# Patient Record
Sex: Female | Born: 1965 | Race: White | Hispanic: No | Marital: Married | Smoking: Never smoker
Health system: Southern US, Community
[De-identification: ages and names within clinical notes are randomized; demographics above are authoritative.]

## PROBLEM LIST (undated history)

## (undated) DIAGNOSIS — K5900 Constipation, unspecified: Secondary | ICD-10-CM

## (undated) DIAGNOSIS — E039 Hypothyroidism, unspecified: Secondary | ICD-10-CM

## (undated) DIAGNOSIS — E785 Hyperlipidemia, unspecified: Secondary | ICD-10-CM

## (undated) DIAGNOSIS — G40909 Epilepsy, unspecified, not intractable, without status epilepticus: Secondary | ICD-10-CM

## (undated) DIAGNOSIS — G43909 Migraine, unspecified, not intractable, without status migrainosus: Secondary | ICD-10-CM

## (undated) HISTORY — DX: Constipation, unspecified: K59.00

## (undated) HISTORY — DX: Epilepsy, unspecified, not intractable, without status epilepticus: G40.909

## (undated) HISTORY — DX: Migraine, unspecified, not intractable, without status migrainosus: G43.909

---

## 1990-11-24 HISTORY — PX: CERVICAL CONE BIOPSY: SUR198

## 1993-11-24 HISTORY — PX: TUBAL LIGATION: SHX77

## 1998-11-24 HISTORY — PX: BREAST BIOPSY: SHX20

## 2000-11-24 HISTORY — PX: TOTAL COLECTOMY: SHX852

## 2003-11-25 HISTORY — PX: RECTOCELE REPAIR: SHX761

## 2003-11-25 HISTORY — PX: CYSTOCELE REPAIR: SHX163

## 2003-11-25 HISTORY — PX: VAGINAL HYSTERECTOMY: SUR661

## 2010-02-25 ENCOUNTER — Inpatient Hospital Stay

## 2010-02-25 NOTE — Unmapped (Signed)
Signed by   LinkLog[employer] on 02/25/2010 at 11:39:34  Patient: [name]  Note: All result statuses are Final unless otherwise noted.    Tests: (1) CT-ABDOMEN AND PELVIS WITH CONTRAST [phone])    Order Note: RadNet Accession Number: [id number]    Order Note:                               [facility]     Patient: Casey Harrington, Casey Harrington   DOB:     [birth year]   MRN:     [id number]   FIN:       [id number]   Accn#:   [id number]                                   Computed Tomography     Exam                       Exam Date/Time       Ordering Physician   CT-ABDOMEN AND PELVIS      02/25/2010 11:06 EDT Dena Billet MD, NAV K   WITH CONTRAST     Reason for Exam   LUQ pain, eval for intra-abdominal adhesions 100 mL isovue 370     Report   CT OF THE ABDOMEN AND PELVIS WITH CONTRAST:     REASON FOR EXAM: Left upper quadrant pain     TECHNIQUE: CT of the abdomen and pelvis performed with 5 mm axial images   with additional coronal and sagittal  reconstructions after the   administration of 100  mL of Isovue-370 intravenous contrast as well as   oral contrast.     COMPARISON: None.     FINDINGS:     CT ABDOMEN:     Evidence of prior total or near total colectomy with anastomosis in the   lower pelvis, the region of the expected location of the rectum, without   complicating features or evidence of obstruction. Small bowel normal in   appearance. There are numerous small hypodense lesions scattered throughout   both lobes of the liver, the largest measuring 11 mm in greatest diameter.   Lesions are too small to accurately characterize. Liver otherwise normal in   appearance. Nondependent 9 mm potentially enhancing focus within the   gallbladder fundus, suspicious for a polyp. Gallbladder otherwise normal in   appearance. The pancreas, spleen, adrenal glands, and kidneys are   unremarkable. No lymphadenopathy or ascites. No acute or suspicious bony   abnormality. Visualized lung bases are clear.     CT PELVIS:     There is again evidence of prior colectomy. Uterus surgically absent. No   lymphadenopathy or ascites. Visualized small bowel pelvis normal in   appearance. No acute or suspicious bony abnormality. Bladder unremarkable.     IMPRESSION:     Status post colectomy without evidence of obstruction.         9 mm potentially enhancing lesion within the nondependent gallbladder,   suspicious for a polyp. Further evaluation with with a right upper quadrant   ultrasound could be helpful.     Numerous small hypodense lesions scattered throughout both lobes of the   liver. In the absence of known malignancy, findings are most likely   secondary to multiple small cysts.   ********** VERIFIED REPORT **********     Dictated: 02/25/2010  11:23 am     Rema Jasmine M.D., Estelle Grumbles   Signed (Electronic Signature):  Rema Jasmine M.D., Estelle Grumbles.          02/25/10   11:31     Technologist: Siri Cole    Order Note:   EMR Routing to: Cherre Blanc - copy to - [id number]    Note: An exclamation mark (!) indicates a result that was not dispersed into   the flowsheet.  Document Creation Date: 02/25/2010 11:39 AM  _______________________________________________________________________    (1) Order result status: Final  Collection or observation date-time: 02/25/2010 11:06:55  Requested date-time: 02/25/2010 10:01:00  Receipt date-time:   Reported date-time: 02/25/2010 11:31:46  Referring Physician:    Ordering Physician:  Non-EMR Physician [facility])  Specimen Source: RAD&Rad Type  Source: QRS  Filler Order Number: [id number]  Lab site: [facility]

## 2010-03-11 ENCOUNTER — Inpatient Hospital Stay

## 2010-03-19 ENCOUNTER — Inpatient Hospital Stay

## 2013-07-14 ENCOUNTER — Ambulatory Visit: Admit: 2013-07-14 | Payer: PRIVATE HEALTH INSURANCE | Attending: Neurology

## 2013-07-14 DIAGNOSIS — R569 Unspecified convulsions: Secondary | ICD-10-CM

## 2013-07-14 MED ORDER — levETIRAcetam (KEPPRA) 500 MG tablet
500 | ORAL_TABLET | Freq: Two times a day (BID) | ORAL | Status: AC
Start: 2013-07-14 — End: 2014-11-09
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
## 2013-07-21 ENCOUNTER — Inpatient Hospital Stay: Admit: 2013-07-21 | Payer: PRIVATE HEALTH INSURANCE | Attending: Neurology

## 2013-07-21 DIAGNOSIS — R569 Unspecified convulsions: Secondary | ICD-10-CM

## 2013-07-21 MED ORDER — GADAVIST (gadobutrol) Soln 6 mL
1 | Freq: Once | INTRAVENOUS | Status: AC | PRN
Start: 2013-07-21 — End: 2013-07-21
  Administered 2013-07-21: 18:00:00 via INTRAVENOUS

## 2013-07-21 NOTE — Unmapped (Signed)
Procedures  [facility]                    PATIENT NAME: Casey Harrington, Casey Harrington   MR#: [id number]  DATE OF BIRTH:   July 26, 1966        CSN#: [id number]  ATTENDING:   Oris Drone. Derek Jack. D.          ADMIT DATE: 07/21/2013  DICTATED BY: Charlestine Night. Ramandeep Arington, M.D.  DATE OF EEG: 07/21/2013                 ELECTROENCEPHALOGRAM        PROCEDURE PERFORMED:  Routine Electroencephalogram      CLINICAL INFORMATION:  This is an evaluation of a 47 year old woman for seizures    MEDICATIONS:  See chart    TECHNICAL SUMMARY:  This EEG was performed with a multi-channel Grass Digital EEG System.  In addition to the standard 10-20 electrode array additional channels monitored ECG and eye movement activity.  The data was recorded digitally.      There is a symmetrical and reactive 9-10 Hz occipital dominant rhythm.  Low voltage 18-22 Hz activity was seen maximally in the anterior regions.        Photic Stimulation:  No abnormalities noted with various frequencies of flickering light  Hyperventilation:  No abnormalities noted during three minutes of over-breathing; there was a mild to moderate build-up of generalized slow activity  Sleep:  Brief periods of drowsiness and spindle stage sleep were seen with no new findings      IMPRESSION:  This patient???s routine EEG during the waking and sleep state showed no focal, lateralizing or epileptiform features and is within the range of normal variation.        Rod Holler  07/21/2013

## 2013-07-22 NOTE — Unmapped (Signed)
I had the opportunity to review the patient's MRI brain and EEG wake and sleep. The patient's EEG wake and sleep revealed no epileptiform abnormalities. According to the radiology read, the patient's MRI showed no definitive lesions that would predispose to future seizures. There was mention of nonspecific white matter changes in the left frontal lobe. I reviewed these and did not feel they had an epileptiform significance. However, when I personally reviewed the MRI brain, I felt of the right hippocampus appeared slightly smaller than the left. There was no obvious signal change on T2 or FLAIR.    Given this, her imaging may be suggestive of seizures arising from the right temporal region. Although the appearance was not classic for mesial temporal sclerosis, there was some atrophy on my review.    I attempted to call the patient back to relate the results of these tests. She was not home. I left a detailed message explaining the above. She is to contact my medical assistant should questions or concerns arise.

## 2013-08-18 ENCOUNTER — Ambulatory Visit: Admit: 2013-08-18 | Payer: PRIVATE HEALTH INSURANCE | Attending: Neurology

## 2013-08-18 DIAGNOSIS — G40109 Localization-related (focal) (partial) symptomatic epilepsy and epileptic syndromes with simple partial seizures, not intractable, without status epilepticus: Secondary | ICD-10-CM

## 2013-08-18 NOTE — Unmapped (Signed)
Subjective:      Patient ID: Casey Harrington is a 47 y.o. female. She presents back to my epilepsy clinic for further evaluation and treatment of likely focal onset seizures.    HPI   According to the patient, she has experienced a marked improvement in her seizure like episodes since starting Keppra 500 mg PO b.i.d. According to the patient, she has only suffered two further episodes concerning for mild seizures since last seeing me. At the time that she saw me last, she was experiencing episodes concerning for seizures at least weekly. According to the patient, the two episodes have only consisted of the development of a ???real bad??? headache and feeling floaty. The headache lasted throughout much of the day and the floaty feeling lasted for approximately 20 minutes each time. However it did not evolve into the other symptoms outlined in my last note. Specifically, there was no impairment of awareness with either of these seizure like episodes.    When the patient first started on Keppra she felt quite exhausted for the first week. However this has improved. 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    She was somewhat concerned that her memory has not gotten any better since initiating on medicine. She is also noticed some possible changes in her personality. She cries more often now. However she is under more stress at home and at school.    Histories:     She has a past medical history of Seizures.    She has past surgical history that includes Hysterectomy; Bladder surgery; Rectal prolapse repair; Tubal ligation; and Total colectomy.    Her family history includes Cancer in her father and mother; Diabetes in her father and mother; Heart disease in her father; and Migraines in her daughter.    She reports that she has never smoked. She does not have any smokeless tobacco history on file. She reports that  drinks alcohol.      Review of Systems    Neurological: Positive for seizures.   All other systems reviewed and are negative.        Allergies:   Review of patient's allergies indicates no known allergies.    Medications:     Outpatient Encounter Prescriptions as of 08/18/2013   Medication Sig Dispense Refill   ??? levETIRAcetam (KEPPRA) 500 MG tablet Take 1 tablet (500 mg total) by mouth 2 times a day.  200 tablet  3   ??? LINACLOTIDE (LINZESS ORAL) Take by mouth.         No facility-administered encounter medications on file as of 08/18/2013.        Objective:       Blood pressure 102/68, pulse 60, height 5' 1.75 (1.568 m), weight 136 lb (61.689 kg).    Neurologic Exam  Mental Status   Oriented to person, place, and time.   Registration: recalls 3 of 3 objects. Recall at 5 minutes: recalls 3 of 3 objects. Follows 3 step commands.   Attention: normal. Concentration: normal.   Speech: speech is normal   Level of consciousness: alert   Knowledge: good. Able to perform simple calculations.   Able to name object. Able to read. Able to repeat. Able to write. Normal comprehension.   Cranial Nerves   CN II   Visual fields full to confrontation.   Visual acuity: normal  Right visual field deficit: none  Left visual field deficit: none  Normal fundoscopic exam bilaterally  CN III, IV, VI   Pupils are equal, round, and reactive to light.  Extraocular motions are normal.   Right pupil: Shape: regular. Reactivity: brisk. Consensual response: intact. Accommodation: intact.   Left pupil: Shape: regular. Reactivity: brisk. Consensual response: intact. Accommodation: intact.   CN III: no CN III palsy  CN VI: no CN VI palsy  Nystagmus: none   Ophthalmoparesis: none  Upgaze: normal  Downgaze: normal  Conjugate gaze: present  Vestibulo-ocular reflex: present   CN V   Facial sensation intact.   CN VII   Facial expression full, symmetric.   CN VIII   CN VIII normal.   CN IX, X   CN IX normal.   CN X normal.   CN XI   CN XI normal.   CN XII   CN XII normal.   Motor Exam   Muscle  bulk: normal  Overall muscle tone: normal  Right arm tone: normal  Left arm tone: normal  Right arm pronator drift: absent  Left arm pronator drift: absent  Right leg tone: normal  Left leg tone: normal   Strength   Strength 5/5 throughout.   Right neck flexion: 5/5   Left neck flexion: 5/5   Right neck extension: 5/5   Left neck extension: 5/5   Right deltoid: 5/5   Left deltoid: 5/5   Right biceps: 5/5   Left biceps: 5/5   Right triceps: 5/5   Left triceps: 5/5   Right wrist flexion: 5/5   Left wrist flexion: 5/5   Right wrist extension: 5/5   Left wrist extension: 5/5   Right interossei: 5/5   Left interossei: 5/5   Right abdominals: 5/5   Left abdominals: 5/5   Right iliopsoas: 5/5   Left iliopsoas: 5/5   Right quadriceps: 5/5   Left quadriceps: 5/5   Right hamstring: 5/5   Left hamstring: 5/5   Right glutei: 5/5   Left glutei: 5/5   Right anterior tibial: 5/5   Left anterior tibial: 5/5   Right posterior tibial: 5/5   Left posterior tibial: 5/5   Right peroneal: 5/5   Left peroneal: 5/5   Right gastroc: 5/5   Left gastroc: 5/5   Sensory Exam   Light touch normal.   Vibration normal.   Proprioception normal.   Pinprick normal.   Gait, Coordination, and Reflexes   Gait   Gait: normal   Coordination   Romberg: negative  Finger to nose coordination: normal  Heel to shin coordination: normal  Tandem walking coordination: normal   Reflexes   Right brachioradialis: 2+  Left brachioradialis: 2+  Right biceps: 2+  Left biceps: 2+  Right triceps: 2+  Left triceps: 2+  Right patellar: 2+  Left patellar: 2+  Right achilles: 2+  Left achilles: 2+  Right grip: 2+  Left grip: 2+  Right plantar: normal  Left plantar: normal   Physical Exam  Constitutional: oriented to person, place, and time. Appears well-developed and well-nourished.   HENT:   Head: Normocephalic.   Eyes: EOM are normal. Pupils are equal, round, and reactive to light.   Neck: Normal range of motion.   Cardiovascular: Normal rate, regular rhythm and normal  heart sounds.   Pulmonary/Chest: Effort normal and breath sounds normal.  Musculoskeletal: Normal range of motion.  Psychiatric: normal mood and affect. Speech is normal.     Assessment:     I. Likely focal onset seizures    Plan:  I had the opportunity to see the patient back in my epilepsy clinic. I was delighted to learn that her seizure like episodes have improved on Keppra. They???ve improved in frequency. In addition, the two episodes that she has experience sense starting Keppra have all been without obvious impairment of awareness.    I???m not solely convinced that the recent personality changes are solely due to Keppra. There are other circumstances in the patient???s life (stresses at home and school) that could be contributing to those personality changes as well. Therefore I didn???t feel that we should give up on this medicine yet.    Given that she continues to have seizure like episodes, I felt that a mild dose adjustment was in order. I recommended increasing her dose to 750 mg PO b.i.d. I warned her that the increase could be met with increased tiredness, drowsiness, and personality changes. She is to let me know if those arise immediately.    I offered the patient neuropsychometric testing to evaluate for her memory concerns. However, she wished to hold on those tests for now.    She has previously undergone tubal ligations. Therefore folic acid is not needed while on an antiepileptic drug. She is not of childbearing potential.    I will plan to see the patient back in my epilepsy clinic in two months. Should questions or concerns arise in the interim, she is to contact my medical assistant.    I spent 30 minutes with the patient. The majority was spent on counseling.

## 2013-09-23 NOTE — Unmapped (Signed)
Spoke with patient. Currently on Generic Keppra. Stated she feels range inside, very agitated, very impatient, and even thought of suicide. Medication was increased about 2-3 weeks ago to 1.5 tabs twice a day. Stated she actually stopped this medication 2 days ago, and is a totally different person. Has not had any suicide thoughts since stopping, more patient, and overall happier. Will be in class the next few hours, leave VM, and she will call back. Please respond back to the clinic pool for any additional follow up.

## 2013-09-23 NOTE — Unmapped (Signed)
Noted Pt. Made aware

## 2013-09-23 NOTE — Unmapped (Signed)
Noted. Dr.Moseley, Pt. Reports aggressive SE of Keppra and DC the use of. Please advise of another AED

## 2013-09-23 NOTE — Unmapped (Signed)
Ok to stay off of levetiracetam. Dr. Alferd Apa can review on his return to decide on another antiepileptic drug

## 2013-09-26 MED ORDER — OXcarbazepine (TRILEPTAL) 300 MG tablet
300 | ORAL_TABLET | Freq: Two times a day (BID) | ORAL | Status: AC
Start: 2013-09-26 — End: 2014-10-13

## 2013-09-26 NOTE — Unmapped (Signed)
I tried to call the patient to discuss her SE on Keppra and apparent discontinuation of this medication. She was not home. I left a message explaining her need to be on an AED for her CPS. Although Lamictal would have been a good medicine to start next, I was fearful of breakthrough seizures with LOC given the long titration needed for that AED. Therefore, I prescribed Trileptal. She is to start with 300 mg BID for one week, then she will increase the dose to 600 mg PO BID ongoing. I warned her about potential SEs on this medication, including (but not limited to) fatigue, sedation, and rash.  She will need to f/u with me in clinic in one month.

## 2013-11-03 ENCOUNTER — Encounter: Payer: PRIVATE HEALTH INSURANCE | Attending: Neurology

## 2013-11-03 NOTE — Unmapped (Signed)
This encounter was created in error - please disregard.

## 2014-10-13 MED ORDER — OXcarbazepine (TRILEPTAL) 300 MG tablet
300 | ORAL_TABLET | Freq: Two times a day (BID) | ORAL | Status: AC
Start: 2014-10-13 — End: 2014-11-09

## 2014-10-13 NOTE — Unmapped (Signed)
I called and relayed message below to pt. Pt had no further questions or concerns. Call completed

## 2014-10-13 NOTE — Unmapped (Signed)
Spoke with patient. She is scheduled for December 19 th. Patient is completely out of Oxcarbazepine 300 mg, takes 600 mg at hs.

## 2014-10-13 NOTE — Unmapped (Signed)
Pt is calling stating that she needs to have refills on her OXcarbazepine (TRILEPTAL) 300 MG tablet. She stated she missed two days worth of this med and is needing to speak with MD. She stated she is having some short term memory loss and is experiencing a HA on R frontal lobe 8-9/10 and is having little relief. Nausea, double vision, blurry vision. She stated the HA lasts for about a week and will have a week break then will start back up again. I transferred her to central scheduling for f/u. Please call her back at>[phone].

## 2014-10-13 NOTE — Unmapped (Signed)
This patient has not seen me in over one year. I provided a one month supply of Trileptal with no refills. She needs to f/u with me in one month for any further refills. We can discuss these questionable SE in clinic. If she has a terrible HA, she needs to go to an ED/urgent care.

## 2014-11-09 ENCOUNTER — Ambulatory Visit: Admit: 2014-11-09 | Payer: PRIVATE HEALTH INSURANCE | Attending: Neurology

## 2014-11-09 DIAGNOSIS — R569 Unspecified convulsions: Secondary | ICD-10-CM

## 2014-11-09 MED ORDER — OXcarbazepine (TRILEPTAL) 300 MG tablet
300 | ORAL_TABLET | Freq: Two times a day (BID) | ORAL | Status: AC
Start: 2014-11-09 — End: 2015-03-08

## 2014-11-09 NOTE — Unmapped (Addendum)
Subjective:      Patient ID: Casey Harrington is a 48 y.o. female. She presents back to my epilepsy clinic for further evaluation and treatment of likely focal onset seizures.      11/09/14:  Since her last visit she stopped taking LEV due to ASE, and began having seizures. She was switched to OXC, and could not tolerate 600mg  BID. She has been taking 600mg  qhs, and has been seizure for ~3 weeks. Taking the morning dose gave her too much fatigue. Currently, she also has an excessive amount of stress in her personal and professional life.     In addition, her memory has continued to decline. She states she has done poorly in school, but was able to graduate and find a job. She has to write everything down because she will consistently forget. She is still debating on whether or not to get neuropsychiatric testing.    She also has paroxsymal right sided headache that can last for up to 5 days. There are no autonomic features associated with this. OTC migraine medications have not helped.    HPI   According to the patient, she has experienced a marked improvement in her seizure like episodes since starting Keppra 500 mg PO b.i.d. According to the patient, she has only suffered two further episodes concerning for mild seizures since last seeing me. At the time that she saw me last, she was experiencing episodes concerning for seizures at least weekly. According to the patient, the two episodes have only consisted of the development of a ???real bad??? headache and feeling floaty. The headache lasted throughout much of the day and the floaty feeling lasted for approximately 20 minutes each time. However it did not evolve into the other symptoms outlined in my last note. Specifically, there was no impairment of awareness with either of these seizure like episodes.    When the patient first started on Keppra she felt quite exhausted for the first week. However this has improved. 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    She was somewhat concerned that her memory has not gotten any better since initiating on medicine. She is also noticed some possible changes in her personality. She cries more often now. However she is under more stress at home and at school.    Histories:     She has a past medical history of Seizures.    She has past surgical history that includes Hysterectomy; Bladder surgery; Rectal prolapse repair; Tubal ligation; and Total colectomy.    Her family history includes Cancer in her father and mother; Diabetes in her father and mother; Heart disease in her father; Migraines in her daughter.    She reports that she has never smoked. She does not have any smokeless tobacco history on file. She reports that she drinks alcohol.      Review of Systems   Neurological: Positive for seizures.   All other systems reviewed and are negative.        Allergies:   Review of patient's allergies indicates no known allergies.    Medications:     Outpatient Encounter Prescriptions as of 11/09/2014   Medication Sig Dispense Refill   ??? LINACLOTIDE (LINZESS ORAL) Take by mouth.     ??? OXcarbazepine (TRILEPTAL) 300 MG tablet Take 1 tablet (300 mg total) by mouth 2 times a day. Indications: COMPLEX-PARTIAL EPILEPSY, SEIZURES 180 tablet 3   ??? [DISCONTINUED]  OXcarbazepine (TRILEPTAL) 300 MG tablet Take 2 tablets (600 mg total) by mouth 2 times a day. Indications: COMPLEX-PARTIAL EPILEPSY 120 tablet 0   ??? [DISCONTINUED] levETIRAcetam (KEPPRA) 500 MG tablet Take 1 tablet (500 mg total) by mouth 2 times a day. 200 tablet 3     No facility-administered encounter medications on file as of 11/09/2014.        Objective:       Blood pressure 100/60, pulse 73, height 5' 2 (1.575 m), weight 134 lb (60.782 kg).    Neurologic Exam  Mental Status   Oriented to person, place, and time.   Registration:  Follows 3 step commands.   Attention: normal. Concentration: normal.   Speech:  speech is normal   Level of consciousness: alert   Knowledge: good. Able to perform simple calculations.   Able to name object. Able to read. Able to repeat. Able to write. Normal comprehension.   Cranial Nerves     CN III, IV, VI   Pupils are equal, round, and reactive to light.  Extraocular motions are normal.   Right pupil: Shape: regular. Reactivity: brisk. Consensual response: intact. Accommodation: intact.   Left pupil: Shape: regular. Reactivity: brisk. Consensual response: intact. Accommodation: intact.   CN III: no CN III palsy  CN VI: no CN VI palsy  Nystagmus: none   Ophthalmoparesis: none  Upgaze: normal  Downgaze: normal  Conjugate gaze: present  Vestibulo-ocular reflex: present   CN V   Facial sensation intact.   CN VII   Facial expression full, symmetric.   CN VIII   CN VIII normal.   CN IX, X   CN IX normal.   CN X normal.   CN XI   CN XI normal.   CN XII   CN XII normal.   Motor Exam   Muscle bulk: normal  Overall muscle tone: normal  Right arm tone: normal  Left arm tone: normal  Right arm pronator drift: absent  Left arm pronator drift: absent  Right leg tone: normal  Left leg tone: normal   Strength   Strength 5/5 throughout.   Right neck flexion: 5/5   Left neck flexion: 5/5   Right neck extension: 5/5   Left neck extension: 5/5   Right deltoid: 5/5   Left deltoid: 5/5   Right biceps: 5/5   Left biceps: 5/5   Right triceps: 5/5   Left triceps: 5/5   Right wrist flexion: 5/5   Left wrist flexion: 5/5   Right wrist extension: 5/5   Left wrist extension: 5/5   Right interossei: 5/5   Left interossei: 5/5   Right abdominals: 5/5   Left abdominals: 5/5   Right iliopsoas: 5/5   Left iliopsoas: 5/5   Right quadriceps: 5/5   Left quadriceps: 5/5   Right hamstring: 5/5   Left hamstring: 5/5   Right glutei: 5/5   Left glutei: 5/5   Right anterior tibial: 5/5   Left anterior tibial: 5/5   Right posterior tibial: 5/5   Left posterior tibial: 5/5   Right peroneal: 5/5   Left peroneal: 5/5   Right gastroc:  5/5   Left gastroc: 5/5   Sensory Exam   Light touch normal.   Vibration normal.   Proprioception normal.   Pinprick normal.   Gait, Coordination, and Reflexes   Gait   Gait: normal   Coordination   Romberg: negative  Finger to nose coordination: normal  Heel to shin coordination: normal  Tandem  walking coordination: normal   Reflexes   Right brachioradialis: 2+  Left brachioradialis: 2+  Right biceps: 2+  Left biceps: 2+  Right triceps: 2+  Left triceps: 2+  Right patellar: 2+  Left patellar: 2+  Right achilles: 2+  Left achilles: 2+  Right grip: 2+  Left grip: 2+  Right plantar: normal  Left plantar: normal   Physical Exam  Constitutional: oriented to person, place, and time. Appears well-developed and well-nourished.   HENT:   Head: Normocephalic.   Eyes: EOM are normal. Pupils are equal, round, and reactive to light.   Neck: Normal range of motion.   Cardiovascular: Normal rate, regular rhythm and normal heart sounds.   Pulmonary/Chest: Effort normal and breath sounds normal.  Musculoskeletal: Normal range of motion.  Psychiatric: normal mood and affect. Speech is normal.     Assessment:     I. 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 Will monitor for ASE and benefits. May consider Oxtellar or Aptiom in the future.    II- Offered neuropsych testing, but she declined at this time. She will call our MA if she changes her mind.    II- Offered indomethecin for diagnosis/treatment of her headaches, but she declined this as well. She will call if she changes her mind.    Follow up in 2-3 months    Safety:   - Driving Risk: Having a seizure while driving puts that patient at risk for injuring themselves, or others. If a patient drives while having uncontrolled seizures, they may be held liable for injuries to others.   Do not drive until they have been seizure free for at least 3 months.     Injury Risk: Please avoid working  from [employer], swimming alone or taking baths in a bathtub, use showers only.     Common Side Effects: Antiepileptic medications may cause somnolence, fatigue, dizziness, and cognitive side effects. Some of the medications can rarely cause bone marrow failure and liver failure. Specific side effects of the antiepileptic medications that the patient is currently taking or that the patient will start taking were also discussed with the patient.     Suicidality: The patient (caregiver, family) was (were) informed of the potential for an increase in the risk of suicidality with antiepileptic drugs. They are asked to notify either me or other healthcare providers of any unusual behavioral changes or suicidal ideation.    I had the opportunity to independently examine the patient and perform an independent history. I agree with the documentation as outlined above. Unfortunately, the patient had side effects when she increased her dose of Trileptal to 600 mg PO b.i.d. She stated that she became too fatigued to function at work. Therefore, without alerting [place], she lowered her dose of Trileptal to 600 mg PO QHS. She states she has experienced no seizures for approximately the last three weeks on this dosage.    I spent extensive time counseling the patient that once a day dosing of immediate release Trileptal would put her increased risk of seizures given the short half-life. She will either need to take this medicine twice a day or change to the extended-release formulation. The patient wanted to try a dose of 300 mg PO b.i.d. for now. Therefore, I switched her prescription to this. I provided a new prescription for a one year supply. She will need to make me  aware if she has any seizures while on this dose, as that will necessitate a dosage increase.    We discussed driving. The patient should not drive until she is completely free of seizures for at least three months. She must report her diagnosis of epilepsy to the  [facility].    The patient continues to complain of significant memory problems. I once again offered her neuropsychometric testing to discover the specific etiology of her memory complaints. She once again declined that consultation.    With regards to the patient???s headache, they have some characteristics of hemicrania continua. However, she has no autonomic signs to go along with them. I offered her a trial of indomethacin empirically. She declined that.    Given the medicine changes I???m making, I will plan to see the patient back in approximately 2 months. Should questions or concerns arise in the interim, my medical assistant should be contacted.    I spent 45 minutes with the patient, with the majority spent on counseling.    Oris Drone Alferd Apa MD  11/09/14

## 2014-11-10 NOTE — Unmapped (Signed)
Addended by: Cherlynn Perches on: 11/10/2014 11:22 AM     Modules accepted: Level of Service

## 2014-12-02 LAB — CBC AND DIFFERENTIAL
Basophils Absolute: 35 cells/uL (ref 0–200)
Basophils Relative: 0.7 %
Eosinophils Absolute: 200 cells/uL (ref 15–500)
Eosinophils Relative: 4 %
Hematocrit: 40.8 % (ref 35.0–45.0)
Hemoglobin: 13.7 g/dL (ref 11.7–15.5)
Lymphocytes Absolute: 1630 cells/uL (ref 850–3900)
Lymphocytes Relative: 32.6 %
MCH: 32.3 pg (ref 27.0–33.0)
MCHC: 33.6 g/dL (ref 32.0–36.0)
MCV: 96.1 fL (ref 80.0–100.0)
MPV: 7.4 fL (ref 7.5–11.5)
Monocytes Absolute: 355 cells/uL (ref 200–950)
Monocytes Relative: 7.1 %
Neutrophils Absolute: 2780 cells/uL (ref 1500–7800)
Neutrophils Relative: 55.6 %
Platelets: 227 10*3/uL (ref 140–400)
RBC: 4.25 10*6/uL (ref 3.80–5.10)
RDW: 13.4 % (ref 11.0–15.0)
WBC: 5 10*3/uL (ref 3.8–10.8)

## 2014-12-02 LAB — VITAMIN D 25 HYDROXY: Vit D, 25-Hydroxy: 54 ng/mL (ref 30–100)

## 2014-12-02 LAB — COMPREHENSIVE METABOLIC PANEL
A/G Ratio: 1.6 (calc) (ref 1.0–2.5)
ALT: 20 U/L (ref 6–29)
AST: 21 U/L (ref 10–35)
Albumin: 4.2 g/dL (ref 3.6–5.1)
Alkaline Phosphatase: 40 U/L (ref 33–115)
BUN: 7 mg/dL (ref 7–25)
CO2: 27 mmol/L (ref 19–30)
Calcium: 9 mg/dL (ref 8.6–10.2)
Chloride: 103 mmol/L (ref 98–110)
Creatinine: 0.87 mg/dL (ref 0.50–1.10)
GFR MDRD Af Amer: 92 mL/min/{1.73_m2} (ref >=60)
Globulin, Total: 2.6 g/dL (calc) (ref 1.9–3.7)
Glucose: 78 mg/dL (ref 65–99)
Potassium: 4 mmol/L (ref 3.5–5.3)
Sodium: 137 mmol/L (ref 135–146)
Total Bilirubin: 0.8 mg/dL (ref 0.2–1.2)
Total Protein: 6.8 g/dL (ref 6.1–8.1)
eGFR Non-Afr. American: 79 mL/min/{1.73_m2} (ref >=60)

## 2014-12-02 LAB — 10-HYDROXYCARBAZEPINE: 10-Hydroxycarbazepine: 10 ug/mL (ref 8.0–35.0)

## 2014-12-08 NOTE — Unmapped (Signed)
I called and lmom with results. Call completed

## 2014-12-08 NOTE — Unmapped (Signed)
-----   Message from Job Founds, MD sent at 12/07/2014  9:28 AM EST -----  When you have a moment, can you call this patient and let her know her recent blood work was normal? Thanks!    ----- Message -----     From: [facility] Results In Interface     Sent: 12/04/2014   8:28 AM       To: Job Founds, MD

## 2015-01-11 ENCOUNTER — Encounter: Payer: PRIVATE HEALTH INSURANCE | Attending: Neurology

## 2015-03-08 ENCOUNTER — Ambulatory Visit: Admit: 2015-03-08 | Payer: PRIVATE HEALTH INSURANCE | Attending: Neurology

## 2015-03-08 DIAGNOSIS — R569 Unspecified convulsions: Secondary | ICD-10-CM

## 2015-03-08 MED ORDER — OXcarbazepine (TRILEPTAL) 300 MG tablet
300 | ORAL_TABLET | Freq: Two times a day (BID) | ORAL | Status: AC
Start: 2015-03-08 — End: 2015-06-28

## 2015-03-08 MED ORDER — omeprazole (PRILOSEC) 20 MG capsule
20 | ORAL_CAPSULE | Freq: Every morning | ORAL | Status: AC
Start: 2015-03-08 — End: 2015-06-28

## 2015-03-08 MED ORDER — indomethacin (INDOCIN) 25 MG capsule
25 | ORAL_CAPSULE | Freq: Two times a day (BID) | ORAL | Status: AC
Start: 2015-03-08 — End: 2015-06-28

## 2015-03-08 NOTE — Unmapped (Signed)
Subjective:      Patient ID: Casey Harrington is a 49 y.o. female. She presents back to my epilepsy clinic for further evaluation and treatment of likely focal onset seizures.    HPI   Since my last clinic visit, the patient has experienced no further spells concerning for seizures. Her continued seizure freedom has come on Trileptal 300 mg PO b.i.d. Since switching to b.i.d. dosing, the patient has noted some mild tiredness in the morning. However, by slightly delaying her a.m. dose until 10:30 AM, she has been able to minimize this. She was not interested in potentially trying an extended-release formulation that she would take once a day at bedtime.    The patient continues to have intermittent headaches concerning for hemicrania continua. She presented to an ER and was prescribed Fioricet for this. She states her last episode was up to two weeks ago but they had been occurring more frequently before this. She will have autonomic symptoms with these headaches.    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    She has past surgical history that includes Hysterectomy; Bladder surgery; Rectal prolapse repair; Tubal ligation; and Total colectomy.    Her family history includes Cancer in her father and mother; Diabetes in her father and mother; Heart disease in her father; Migraines in her daughter.    She reports that she has never smoked. She does not have any smokeless tobacco history on file. She reports that she drinks alcohol.      Review of Systems   Neurological: Positive for seizures.   All other systems reviewed and are negative.        Allergies:   Review of patient's allergies indicates no known allergies.    Medications:     Outpatient Encounter Prescriptions as of 03/08/2015   Medication Sig Dispense Refill   ??? butalbital-aspirin-caffeine (FIORINAL) 50-325-40 mg Tab Take 1 tablet by mouth every 6 hours as needed.     ??? indomethacin  (INDOCIN) 25 MG capsule Take 1 capsule (25 mg total) by mouth 2 times a day with meals. 60 capsule 11   ??? LINACLOTIDE (LINZESS ORAL) Take by mouth.     ??? omeprazole (PRILOSEC) 20 MG capsule Take 1 capsule (20 mg total) by mouth every morning before breakfast. 30 capsule 11   ??? OXcarbazepine (TRILEPTAL) 300 MG tablet Take 1 tablet (300 mg total) by mouth 2 times a day. Indications: COMPLEX-PARTIAL EPILEPSY, SEIZURES 180 tablet 3   ??? [DISCONTINUED] OXcarbazepine (TRILEPTAL) 300 MG tablet Take 1 tablet (300 mg total) by mouth 2 times a day. Indications: COMPLEX-PARTIAL EPILEPSY, SEIZURES 180 tablet 3     No facility-administered encounter medications on file as of 03/08/2015.        Objective:       Blood pressure 102/58, pulse 55, height 5' 2 (1.575 m), weight 132 lb (59.875 kg), SpO2 99 %.    Neurologic Exam  Mental Status   Oriented to person, place, and time.   Registration:  Follows 3 step commands.   Attention: normal. Concentration: normal.   Speech: speech is normal   Level of consciousness: alert   Knowledge: good. Able to perform simple calculations.   Able to name object. Able to read. Able to repeat. Able to write. Normal comprehension.   Cranial Nerves     CN III, IV, VI   Pupils are equal, round, and reactive to light.  Extraocular motions  are normal.   Right pupil: Shape: regular. Reactivity: brisk. Consensual response: intact. Accommodation: intact.   Left pupil: Shape: regular. Reactivity: brisk. Consensual response: intact. Accommodation: intact.   CN III: no CN III palsy  CN VI: no CN VI palsy  Nystagmus: none   Ophthalmoparesis: none  Upgaze: normal  Downgaze: normal  Conjugate gaze: present  Vestibulo-ocular reflex: present   CN V   Facial sensation intact.   CN VII   Facial expression full, symmetric.   CN VIII   CN VIII normal.   CN IX, X   CN IX normal.   CN X normal.   CN XI   CN XI normal.   CN XII   CN XII normal.   Motor Exam   Muscle bulk: normal  Overall muscle tone: normal  Right arm  tone: normal  Left arm tone: normal  Right arm pronator drift: absent  Left arm pronator drift: absent  Right leg tone: normal  Left leg tone: normal   Strength   Strength 5/5 throughout.   Right neck flexion: 5/5   Left neck flexion: 5/5   Right neck extension: 5/5   Left neck extension: 5/5   Right deltoid: 5/5   Left deltoid: 5/5   Right biceps: 5/5   Left biceps: 5/5   Right triceps: 5/5   Left triceps: 5/5   Right wrist flexion: 5/5   Left wrist flexion: 5/5   Right wrist extension: 5/5   Left wrist extension: 5/5   Right interossei: 5/5   Left interossei: 5/5   Right abdominals: 5/5   Left abdominals: 5/5   Right iliopsoas: 5/5   Left iliopsoas: 5/5   Right quadriceps: 5/5   Left quadriceps: 5/5   Right hamstring: 5/5   Left hamstring: 5/5   Right glutei: 5/5   Left glutei: 5/5   Right anterior tibial: 5/5   Left anterior tibial: 5/5   Right posterior tibial: 5/5   Left posterior tibial: 5/5   Right peroneal: 5/5   Left peroneal: 5/5   Right gastroc: 5/5   Left gastroc: 5/5   Sensory Exam   Light touch normal.   Vibration normal.   Proprioception normal.   Pinprick normal.   Gait, Coordination, and Reflexes   Gait   Gait: normal   Coordination   Romberg: negative  Finger to nose coordination: normal  Heel to shin coordination: normal  Tandem walking coordination: normal   Reflexes   Right brachioradialis: 2+  Left brachioradialis: 2+  Right biceps: 2+  Left biceps: 2+  Right triceps: 2+  Left triceps: 2+  Right patellar: 2+  Left patellar: 2+  Right achilles: 2+  Left achilles: 2+  Right grip: 2+  Left grip: 2+  Right plantar: normal  Left plantar: normal   Physical Exam  Constitutional: oriented to person, place, and time. Appears well-developed and well-nourished.   HENT:   Head: Normocephalic.   Eyes: EOM are normal. Pupils are equal, round, and reactive to light.   Neck: Normal range of motion.   Cardiovascular: Normal rate, regular rhythm and normal heart sounds.   Pulmonary/Chest: Effort normal and  breath sounds normal.  Musculoskeletal: Normal range of motion.  Psychiatric: normal mood and affect. Speech is normal.     Assessment:     I. Focal onset seizures  II. 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 She has fortunately suffered no further seizures since her last clinic visit. She has largely tolerated switching her Trileptal to 300 mg PO b.i.d. Therefore we will continue this medicine. I provided a year???s prescription.    With regards to the patient???s headaches, they have symptoms that would be consistent with hemicrania continua. We discussed that Fioricet was not a good long-term treatment strategy for these. Therefore, she was finally amenable to a trial of indomethacin. She will start with a dose of 25 mg PO b.i.d. She is aware that this dose can be increased if needed up to 300 mg a day. She is aware that the chief side effect is G.I. upset. She is to take the medicine with meals. I also prescribed omeprazole 20 mg PO Q day to take along with it.    I will plan to see the patient back in clinic in one to two months. Should questions or concerns arise in the interim, my medical assistant should be contacted.    I spent 25 minutes with the patient, with the majority spent on counseling.

## 2015-05-03 ENCOUNTER — Encounter: Payer: PRIVATE HEALTH INSURANCE | Attending: Neurology

## 2015-05-14 NOTE — Unmapped (Signed)
Pt wants to know if she can still take her seizure meds w/Xarelto? Please advise.

## 2015-05-14 NOTE — Unmapped (Signed)
Noted, routed to MD on call

## 2015-05-14 NOTE — Unmapped (Signed)
If she is on OXC which is what the chart says, then it may lower the dose of xarelto, that's all could find  SM

## 2015-05-15 NOTE — Unmapped (Signed)
I called and spoke with pt and informed her of message below from MD. 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 I advised her to contact the MD that placed her on medication and ask if there is a recommended dose that she can take that wouldn't affect the oxcarb. She stated that she is unsure about what she should do and then stated that she would have to stop taking either the AED or medication for DVT. I advised her to not stop taking both medications but to consult pharmacy that she picks rx up from to see if there is any interaction with the medications. Pt stated that she will and that she also needed to schedule a f/u with MD in [facility]. Pt was scheduled for 8/4 at 12:50 with MD. No further questions or concerns. Call completed

## 2015-06-28 ENCOUNTER — Encounter: Admit: 2015-06-28 | Payer: PRIVATE HEALTH INSURANCE | Attending: Neurology

## 2015-06-28 MED ORDER — indomethacin (INDOCIN) 25 MG capsule
25 | ORAL_CAPSULE | Freq: Two times a day (BID) | ORAL | Status: AC
Start: 2015-06-28 — End: 2016-08-28

## 2015-06-28 MED ORDER — omeprazole (PRILOSEC) 20 MG capsule
20 | ORAL_CAPSULE | Freq: Every morning | ORAL | Status: AC
Start: 2015-06-28 — End: 2016-08-28

## 2015-06-28 MED ORDER — OXcarbazepine (TRILEPTAL) 300 MG tablet
300 | ORAL_TABLET | Freq: Two times a day (BID) | ORAL | Status: AC
Start: 2015-06-28 — End: 2016-08-28

## 2015-06-28 NOTE — Unmapped (Signed)
Subjective:      Patient ID: Casey Harrington is a 49 y.o. female. She presents back to my epilepsy clinic for further evaluation and treatment of likely focal onset seizures.    HPI   The patient has experienced no further seizures since the time of her last clinic visit. Since her last clinic visit, she has been diagnosed with a DVT in the lower extremities. She had symptoms of pain in the lower legs that led to the diagnosis. She is now on Xarelto.    Given that she is now on Xarelto, her prescribing physician advised that she stopped indomethacin. This was significantly helping to take the edge off of her headaches. She states her headaches have worsened since coming off indomethacin 25 mg PO b.i.d. She denied side effects on indomethacin such as G.I. upset when she was on this as well as Prilosec.    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    She has past surgical history that includes Hysterectomy; Bladder surgery; Rectal prolapse repair; Tubal ligation; and Total colectomy.    Her family history includes Cancer in her father and mother; Diabetes in her father and mother; Heart disease in her father; Migraines in her daughter.    She reports that she has never smoked. She does not have any smokeless tobacco history on file. She reports that she drinks alcohol.      Review of Systems   Neurological: Positive for seizures.   All other systems reviewed and are negative.        Allergies:   Review of patient's allergies indicates no known allergies.    Medications:     Outpatient Encounter Prescriptions as of 06/28/2015   Medication Sig Dispense Refill   ??? butalbital-aspirin-caffeine (FIORINAL) 50-325-40 mg Tab Take 1 tablet by mouth every 6 hours as needed.     ??? indomethacin (INDOCIN) 25 MG capsule Take 1 capsule (25 mg total) by mouth 2 times a day with meals. 180 capsule 3   ??? LINACLOTIDE (LINZESS ORAL) Take by mouth.     ??? omeprazole  (PRILOSEC) 20 MG capsule Take 1 capsule (20 mg total) by mouth every morning before breakfast. 90 capsule 3   ??? OXcarbazepine (TRILEPTAL) 300 MG tablet Take 1 tablet (300 mg total) by mouth 2 times a day. Indications: COMPLEX-PARTIAL EPILEPSY, SEIZURES 180 tablet 3   ??? rivaroxaban (XARELTO) 20 mg Tab Take 20 mg by mouth daily.     ??? [DISCONTINUED] indomethacin (INDOCIN) 25 MG capsule Take 1 capsule (25 mg total) by mouth 2 times a day with meals. 60 capsule 11   ??? [DISCONTINUED] omeprazole (PRILOSEC) 20 MG capsule Take 1 capsule (20 mg total) by mouth every morning before breakfast. 30 capsule 11   ??? [DISCONTINUED] OXcarbazepine (TRILEPTAL) 300 MG tablet Take 1 tablet (300 mg total) by mouth 2 times a day. Indications: COMPLEX-PARTIAL EPILEPSY, SEIZURES 180 tablet 3     No facility-administered encounter medications on file as of 06/28/2015.        Objective:       Blood pressure 100/66, pulse 62, height 5' 2 (1.575 m), weight 128 lb (58.06 kg).    Neurologic Exam  Mental Status   Oriented to person, place, and time.   Registration:  Follows 3 step commands.   Attention: normal. Concentration: normal.   Speech: speech is normal   Level of consciousness: alert   Knowledge: good. Able to  perform simple calculations.   Able to name object. Able to read. Able to repeat. Able to write. Normal comprehension.   Cranial Nerves     CN III, IV, VI   Pupils are equal, round, and reactive to light.  Extraocular motions are normal.   Right pupil: Shape: regular. Reactivity: brisk. Consensual response: intact. Accommodation: intact.   Left pupil: Shape: regular. Reactivity: brisk. Consensual response: intact. Accommodation: intact.   CN III: no CN III palsy  CN VI: no CN VI palsy  Nystagmus: none   Ophthalmoparesis: none  Upgaze: normal  Downgaze: normal  Conjugate gaze: present  Vestibulo-ocular reflex: present   CN V   Facial sensation intact.   CN VII   Facial expression full, symmetric.   CN VIII   CN VIII normal.   CN IX, X    CN IX normal.   CN X normal.   CN XI   CN XI normal.   CN XII   CN XII normal.   Motor Exam   Muscle bulk: normal  Overall muscle tone: normal  Right arm tone: normal  Left arm tone: normal  Right arm pronator drift: absent  Left arm pronator drift: absent  Right leg tone: normal  Left leg tone: normal   Strength   Strength 5/5 throughout.   Right neck flexion: 5/5   Left neck flexion: 5/5   Right neck extension: 5/5   Left neck extension: 5/5   Right deltoid: 5/5   Left deltoid: 5/5   Right biceps: 5/5   Left biceps: 5/5   Right triceps: 5/5   Left triceps: 5/5   Right wrist flexion: 5/5   Left wrist flexion: 5/5   Right wrist extension: 5/5   Left wrist extension: 5/5   Right interossei: 5/5   Left interossei: 5/5   Right abdominals: 5/5   Left abdominals: 5/5   Right iliopsoas: 5/5   Left iliopsoas: 5/5   Right quadriceps: 5/5   Left quadriceps: 5/5   Right hamstring: 5/5   Left hamstring: 5/5   Right glutei: 5/5   Left glutei: 5/5   Right anterior tibial: 5/5   Left anterior tibial: 5/5   Right posterior tibial: 5/5   Left posterior tibial: 5/5   Right peroneal: 5/5   Left peroneal: 5/5   Right gastroc: 5/5   Left gastroc: 5/5   Sensory Exam   Light touch normal.   Vibration normal.   Proprioception normal.   Pinprick normal.   Gait, Coordination, and Reflexes   Gait   Gait: normal   Coordination   Romberg: negative  Finger to nose coordination: normal  Heel to shin coordination: normal  Tandem walking coordination: normal   Reflexes   Right brachioradialis: 2+  Left brachioradialis: 2+  Right biceps: 2+  Left biceps: 2+  Right triceps: 2+  Left triceps: 2+  Right patellar: 2+  Left patellar: 2+  Right achilles: 2+  Left achilles: 2+  Right grip: 2+  Left grip: 2+  Right plantar: normal  Left plantar: normal   Physical Exam  Constitutional: oriented to person, place, and time. Appears well-developed and well-nourished.   HENT:   Head: Normocephalic.   Eyes: EOM are normal. Pupils are equal, round, and reactive  to light.   Neck: Normal range of motion.   Cardiovascular: Normal rate, regular rhythm and normal heart sounds.   Pulmonary/Chest: Effort normal and breath sounds normal.  Musculoskeletal: Normal range of motion.  Psychiatric: normal mood  and affect. Speech is normal.     Assessment:     I. Focal onset seizures  II. Reports of memory impairment  III. 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 DVT  Plan:     I had the opportunity to see the patient in clinic. I was delighted to learn that she has remained seizure free. Therefore we will continue Trileptal 300 mg PO b.i.d. Although there is a theoretical interaction with Xarelto, rendering it less effective, I am not truly worried about this given the low dose of Trileptal that she is on and moderate nature of the interaction to begin with. However, she should run this by the prescribing physician for Xarelto.    With regards to indomethacin, I believe the increased risk of bleeding is small and that from my perspective it would be safe for her to be on both indomethacin and Xarelto. She is aware of symptoms concerning for bleeding that would necessitate seeing a physician immediately. However, she absolutely has to run this by the Xarelto prescribing physician before she can resume this.    I will plan to see the patient back in my clinic in approximately one year provided she remains seizure free and her headaches are decently controlled. Should questions or concerns arise in the interim, my medical assistant should be contacted.    I spent 25 minutes with the patient, with the majority spent on counseling.

## 2016-01-31 ENCOUNTER — Emergency Department: Admit: 2016-01-31 | Payer: PRIVATE HEALTH INSURANCE

## 2016-01-31 ENCOUNTER — Inpatient Hospital Stay
Admit: 2016-01-31 | Discharge: 2016-01-31 | Disposition: A | Discharge status: Home / Self Care | Payer: PRIVATE HEALTH INSURANCE

## 2016-01-31 DIAGNOSIS — K625 Hemorrhage of anus and rectum: Secondary | ICD-10-CM

## 2016-01-31 LAB — BASIC METABOLIC PANEL
Anion Gap: 7 mmol/L (ref 3–16)
BUN: 16 mg/dL (ref 7–25)
CO2: 29 mmol/L (ref 21–33)
Calcium: 9.6 mg/dL (ref 8.6–10.3)
Chloride: 103 mmol/L (ref 98–110)
Creatinine: 0.97 mg/dL (ref 0.60–1.30)
Glucose: 72 mg/dL (ref 70–100)
Osmolality, Calculated: 288 mOsm/kg (ref 278–305)
Potassium: 4 mmol/L (ref 3.5–5.3)
Sodium: 139 mmol/L (ref 133–146)
eGFR AA CKD-EPI: 80 See note.
eGFR NONAA CKD-EPI: 69 See note.

## 2016-01-31 LAB — DIFFERENTIAL
Basophils Absolute: 21 /uL (ref 0–200)
Basophils Relative: 0.5 % (ref 0.0–1.0)
Eosinophils Absolute: 113 /uL (ref 15–500)
Eosinophils Relative: 2.7 % (ref 0.0–8.0)
Lymphocytes Absolute: 1239 /uL (ref 850–3900)
Lymphocytes Relative: 29.5 % (ref 15.0–45.0)
Monocytes Absolute: 491 /uL (ref 200–950)
Monocytes Relative: 11.7 % (ref 0.0–12.0)
Neutrophils Absolute: 2335 /uL (ref 1500–7800)
Neutrophils Relative: 55.6 % (ref 40.0–80.0)

## 2016-01-31 LAB — CBC
Hematocrit: 40.9 % (ref 35.0–45.0)
Hemoglobin: 13.8 g/dL (ref 11.7–15.5)
MCH: 31.3 pg (ref 27.0–33.0)
MCHC: 33.8 g/dL (ref 32.0–36.0)
MCV: 92.5 fL (ref 80.0–100.0)
MPV: 6.7 fL — ABNORMAL LOW (ref 7.5–11.5)
Platelets: 250 10E3/uL (ref 140–400)
RBC: 4.42 10E6/uL (ref 3.80–5.10)
RDW: 13 % (ref 11.0–15.0)
WBC: 4.2 10E3/uL (ref 3.8–10.8)

## 2016-01-31 LAB — ANTIBODY SCREEN: Antibody Screen: NEGATIVE

## 2016-01-31 LAB — URINALYSIS W/RFL TO MICROSCOPIC
Bilirubin, UA: NEGATIVE
Blood, UA: NEGATIVE
Glucose, UA: NEGATIVE mg/dL
Ketones, UA: NEGATIVE mg/dL
Leukocyte Esterase, UA: NEGATIVE
Nitrite, UA: NEGATIVE
Protein, UA: NEGATIVE mg/dL
Specific Gravity, UA: 1.027 (ref 1.005–1.035)
Urobilinogen, UA: <2 mg/dL (ref 0.2–1.9)
pH, UA: 7 (ref 5.0–8.0)

## 2016-01-31 LAB — ABO/RH: Rh Type: NEGATIVE

## 2016-01-31 LAB — APTT: aPTT: 30 seconds (ref 25.5–35.0)

## 2016-01-31 LAB — PROTIME-INR
INR: 1.1 (ref 0.9–1.1)
Protime: 14.7 s — ABNORMAL HIGH (ref 11.6–14.4)

## 2016-01-31 MED ORDER — sodium chloride 0.9 % infusion
INTRAVENOUS | Status: AC
Start: 2016-01-31 — End: 2016-01-31
  Administered 2016-01-31: 17:00:00 50

## 2016-01-31 MED ORDER — OMNIPAQUE (iohexol) 350 mg iodine/mL 125 mL
350 | Freq: Once | INTRAVENOUS | Status: AC | PRN
Start: 2016-01-31 — End: 2016-01-31
  Administered 2016-01-31: 17:00:00 125 mL via INTRAVENOUS

## 2016-01-31 MED ORDER — OMNIPAQUE (iohexol) 240 mg iodine/mL 50 mL
240 | Freq: Once | INTRAVENOUS | Status: AC | PRN
Start: 2016-01-31 — End: 2016-01-31
  Administered 2016-01-31: 17:00:00 50 mL via ORAL

## 2016-01-31 MED ORDER — sodium chloride 0.9 % 1,000 mL IV fluid
Freq: Once | INTRAVENOUS | Status: AC
Start: 2016-01-31 — End: 2016-01-31
  Administered 2016-01-31: 16:00:00 via INTRAVENOUS

## 2016-01-31 MED FILL — SODIUM CHLORIDE 0.9 % INTRAVENOUS SOLUTION: INTRAVENOUS | Qty: 50

## 2016-01-31 MED FILL — SODIUM CHLORIDE 0.9 % INTRAVENOUS SOLUTION: INTRAVENOUS | Qty: 1000

## 2016-01-31 NOTE — Unmapped (Signed)
[facility] ED Note    Date of service:  01/31/2016    Reason for Visit: Blood In Stools      Patient History     HPI This is a 50 year old Caucasian female with a history of seizure disorder and episodic headaches for which she has been taking indomethacin once daily for almost a year.  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  Last summer she was diagnosed with a right lower leg deep vein thrombosis, unprovoked, and started on Xarelto.  States she never had any kind of follow-up however after that diagnosis and continues to take the Xarelto.  She was prescribed omeprazole initially to take with the indomethacin to prevent upset stomach but hasn't taken that for 5-6 months since she never had any stomach upset.  She presents to the ED with complaints of a 2 week history of intermittent small amounts of bright red blood per rectum.  States she typically has 4-5 bowel movements a day since her surgery but has had decreased output recently, had only 2 movements yesterday.  Last night she developed left-sided abdominal cramping, had a small BM then, light brown.  When she went to the bathroom to urinate this morning, she passed an estimated half cup of bright red blood per rectum without stool.  She describes postural lightheadedness, has otherwise been well without chest tightness, no shortness of breath.  No fever, chills, reports mild nausea, no vomiting.  No changes in urinary habits.  She otherwise has no complaints.  The patient's initial surgeon is no longer around however she had established care with Dr. Everardo All remotely.  Had also been followed by Dr. Philis Nettle for gastroenterology but reports no colonoscopy for several years.       Past Medical History   Diagnosis Date   ??? Seizures      Dr. Alferd Apa   ??? Headache      ?hemicrania continua    ??? Right leg DVT  2016     unprovoked with neg hypercoag eval per patient   ??? Anticoagulated      Xarelto       Past Surgical History   Procedure Laterality Date   ??? Total colectomy       near total (18 cm left per pt), ischemic bowel, surgeon no longer around, had established care with Dr. Everardo All, Dr. Philis Nettle (GI)   ??? Hysterectomy     ??? Tubal ligation     ??? Bladder surgery     ??? Rectal prolapse repair         Patient  reports that she has never smoked. She has never used smokeless tobacco. She reports that she drinks alcohol. She reports that she does not use illicit drugs.      Previous Medications    INDOMETHACIN (INDOCIN) 25 MG CAPSULE    Take 25 mg by mouth daily.    OXCARBAZEPINE (TRILEPTAL) 300 MG TABLET    Take 300 mg by mouth daily.    RIVAROXABAN (XARELTO) 20 MG TAB    Take 20 mg by mouth daily.       Allergies:   Allergies as of 01/31/2016   ??? (No Known Allergies)       Review of Systems     Review of Systems   Constitutional: Negative for fever and chills.   HENT: Negative for congestion, rhinorrhea and sore throat.  Respiratory: Negative for cough and shortness of breath.    Cardiovascular: Negative for chest pain and palpitations.   Gastrointestinal: Positive for nausea, abdominal pain (left sided) and blood in stool. Negative for vomiting.   Genitourinary: Negative for dysuria, urgency and frequency.   Musculoskeletal: Negative for myalgias and arthralgias.   Skin: Negative for rash.   Neurological: Negative for dizziness, light-headedness and headaches.   All other systems reviewed and are negative.          Physical Exam     ED Triage Vitals   Vital Signs Group      Temp 01/31/16 0846 97.8 ??F (36.6 ??C)      Temp Source 01/31/16 0846 Oral      Heart Rate 01/31/16 0846 64      Heart Rate Source 01/31/16 0846 Automatic      Resp 01/31/16 0846 18      SpO2 01/31/16 0846 100 %      BP 01/31/16 0846 126/80 mmHg      BP Location 01/31/16 0846 Left arm      BP Method 01/31/16 0846 Automatic      Patient Position 01/31/16  0846 Lying   SpO2 01/31/16 0846 100 %   O2 Device 01/31/16 0846 None (Room air)       Physical Exam   Vitals reviewed.  Constitutional: She appears well-developed and well-nourished. No distress.   HENT:   Head: Normocephalic and atraumatic.   Mouth/Throat: Mucous membranes are normal.   Eyes: Conjunctivae and EOM are normal.   Neck: Normal range of motion and phonation normal. Neck supple.   Cardiovascular: Normal rate and regular rhythm.  Exam reveals no gallop and no friction rub.    No murmur heard.  Pulmonary/Chest: Effort normal. No respiratory distress. She has no wheezes. She has no rhonchi. She has no rales.   Abdominal: Soft. She exhibits no distension. There is no hepatosplenomegaly. There is tenderness in the left upper quadrant and left lower quadrant. There is no rebound and no guarding.   Genitourinary: Rectal exam shows external hemorrhoid (nontender, not thrombosed) and internal hemorrhoid. Guaiac negative stool (no stool or blood in vault).   Neurological: She is alert.  She is oriented to person, place and time.    Skin: Skin is warm, dry and intact. No lesion, no petechiae and no rash noted.   Psychiatric: She has a normal mood and affect. Her behavior is normal.         Procedures    ED Course and MDM     CAMBREA KIRT is a 50 y.o. female who presented to the emergency department with Blood In Stools    The patient reports bright red blood per rectum with no stool or blood was found on digital rectal exam.  She does have left upper and left lower quadrant tenderness.  Vital signs are stable.  Exam is otherwise unremarkable.  IV access was established.  She was given IV fluids since she is getting an IV dye load.  She declined medication for pain or nausea.    Labs include an unremarkable CBC with a white blood cell count of 4.2, hemoglobin 13.8, hematocrit 40.9 and a platelet count of 250,000, normal differential.  Renal panel is unremarkable.  Coagulations are essentially unremarkable.   Urinalysis is unremarkable.    CT of the abdomen and pelvis with oral and IV contrast shows no acute abnormality within the abdomen/pelvis, no source of GI bleeding identified.  Prior changes  of near total colectomy noted.    The patient was reassured.  Given the lack of obvious bleeding on exam, stable diagnostics and unremarkable CT, I feel she is stable for outpatient management with her gastroenterologist for colonoscopy for further evaluation.  She is also encouraged to talk with her primary care physician regarding the Xarelto and whether or not it is still necessary.  She is encouraged to discontinue the indomethacin while taking the Xarelto and switch to Tylenol if needed for headaches.    The patient tolerated their visit well.  The patient and / or the family were informed of the results of any tests, a time was given to answer questions, a plan was proposed and they agreed with plan.     DISCHARGE DIAGNOSES:      ICD-9-CM ICD-10-CM    1. BRBPR (bright red blood per rectum) 569.3 K62.5    2. History of colectomy V45.89 Z90.49        New Prescriptions    No medications on file               Willette Pa, [place]  01/31/16 1326

## 2016-01-31 NOTE — Unmapped (Signed)
Bloody stools: has been having intermittent bleeding with bowel movements for past 2 weeks, but since 3 am had intense pain and blood is coming out nonstop even without bowel movements, pt reports nausea and bloated. Hx of colon removed due to ischemia but 18 cm left

## 2016-01-31 NOTE — Unmapped (Signed)
Oral contrast complete.

## 2016-01-31 NOTE — Unmapped (Signed)
To Radiology for CT.

## 2016-01-31 NOTE — Unmapped (Signed)
ED Attending Attestation Note    Date of service:  01/31/2016    This patient was seen by the advanced practice provider.  I have seen and examined the patient, agree with the workup, evaluation, management and diagnosis.  The care plan has been discussed and I concur.      My assessment reveals a 50 y.o. female with a complicated abdominal history, status post remote near total colectomy, now with reportedly only 18 cm of colon left, presenting to the ED with bright red blood per rectum, large amount, without fever, but with some left lower quadrant abdominal pain.  On exam she has clear lungs to auscultation bilaterally, cardiac exam shows regular rate and rhythm without murmurs, abdomen is tender to palpation in left lower quadrant without guarding or rebound tenderness, normal active bowel sounds.  Digital rectal exam performed by a APP was reported as Hemoccult negative, and no blood on glove.  She did have nonthrombosed internal and external hemorrhoids reported.  She is in no acute distress at this time, and is moving all extremities spontaneously.    Hemoglobin was 13.8, unremarkable CT abdomen, no further hematochezia or bright red blood per rectum throughout ED stay.  Patient is stable for discharge.  Will follow with gastroenterologist for colonoscopy as outpatient.

## 2016-01-31 NOTE — Unmapped (Signed)
Ambulatory to bathroom to void. Gait even.

## 2016-01-31 NOTE — Unmapped (Signed)
Placed patient on continuous cardiac monitoring and pulse oximetry. Vitals are set to cycle q30 minutes. Call light within reach. Family at bedside.

## 2016-05-23 ENCOUNTER — Ambulatory Visit: Admit: 2016-05-23 | Discharge: 2016-05-23 | Payer: PRIVATE HEALTH INSURANCE

## 2016-05-23 DIAGNOSIS — Z01419 Encounter for gynecological examination (general) (routine) without abnormal findings: Secondary | ICD-10-CM

## 2016-05-23 NOTE — Unmapped (Signed)
Subjective:       Casey Harrington is a 50 y.o. female here for a routine exam.  Current complaints: none.  Personal health questionnaire reviewed: yes.     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 Patient has had a hysterectomy.  Contraception: status post hysterectomy  Last Pap: 2016. Results were: normal  Last mammogram: 2017. Results were: normal    Obstetric History  OB History   Gravida Para Term Preterm AB SAB TAB Ectopic Multiple Living   6 4 4  2 2    4       # Outcome Date GA Lbr Len/2nd Weight Sex Delivery Anes PTL Lv   6 Term  [redacted]w[redacted]d   F Vag-Spont  Y Y   5 Term  [redacted]w[redacted]d   M    Y   4 Term  [redacted]w[redacted]d   F    Y   3 Term  [redacted]w[redacted]d   F    Y   2 SAB  [redacted]w[redacted]d   U       1 SAB  [redacted]w[redacted]d   U                The following portions of the patient's history were reviewed and updated as appropriate: allergies, current medications, past family history, past medical history, past social history, past surgical history and problem list.    Review of Systems  Pertinent items are noted in HPI.      Objective:      BP 106/60 mmHg   Pulse 70   Ht 5' 2 (1.575 m)   Wt 129 lb (58.514 kg)   BMI 23.59 kg/m2   SpO2 98%  General appearance: alert, appears stated age and cooperative  Head: Normocephalic, without obvious abnormality, atraumatic  Eyes: conjunctivae/corneas clear. PERRL, EOM's intact. Fundi benign.  Neck: no adenopathy, no carotid bruit, no JVD, supple, symmetrical, trachea midline and thyroid not enlarged, symmetric, no tenderness/mass/nodules  Back: symmetric, no curvature. ROM normal. No CVA tenderness. BREAST AUGMENTATION  Lungs: clear  to auscultation bilaterally and no respiratory distress  Breasts: normal appearance, no masses or tenderness  Heart: regular rate and rhythm, S1, S2 normal, no murmur, click, rub or gallop  Abdomen: soft, non-tender; bowel sounds normal; no masses,  no organomegaly  Pelvic: external genitalia normal, no adnexal masses or tenderness, no cervical motion tenderness, rectovaginal septum normal and vagina normal without discharge  Extremities: extremities normal, atraumatic, no cyanosis or edema  Pulses: 2+ and symmetric  Skin: Skin color, texture, turgor normal. No rashes or lesions  Lymph nodes: Cervical, supraclavicular, and axillary nodes normal.  Neurologic: Grossly normal      Assessment:      Healthy female exam.    Perimenopausal     Plan:      Education reviewed: self breast exams.  Mammogram ordered.  Follow up in: 1 year.

## 2016-08-28 ENCOUNTER — Ambulatory Visit: Admit: 2016-08-28 | Payer: PRIVATE HEALTH INSURANCE | Attending: Neurology

## 2016-08-28 ENCOUNTER — Ambulatory Visit: Admit: 2016-08-28 | Payer: PRIVATE HEALTH INSURANCE

## 2016-08-28 DIAGNOSIS — R569 Unspecified convulsions: Secondary | ICD-10-CM

## 2016-08-28 LAB — OXCARBAZEPINE LEVEL: Oxcarbazepine: NOT DETECTED ug/mL (ref 10–35)

## 2016-08-28 MED ORDER — omeprazole (PRILOSEC) 20 MG capsule
20 | ORAL_CAPSULE | Freq: Every morning | ORAL | 3 refills | Status: AC
Start: 2016-08-28 — End: ?

## 2016-08-28 MED ORDER — indomethacin (INDOCIN) 25 MG capsule
25 | ORAL_CAPSULE | Freq: Two times a day (BID) | ORAL | 3 refills | Status: AC
Start: 2016-08-28 — End: ?

## 2016-08-28 MED ORDER — OXcarbazepine 300 mg Tb24
300 | ORAL_TABLET | Freq: Every evening | ORAL | 3 refills | Status: AC
Start: 2016-08-28 — End: 2016-09-01

## 2016-08-28 NOTE — Progress Notes (Signed)
Subjective:      Patient ID: Casey Harrington is a 50 y.o. female. She presents back to my epilepsy clinic for further evaluation and treatment of likely focal onset seizures.    HPI   Since the patients last clinic visit, she has not suffered any further seizures. Her continued seizure freedom has actually come on a lower dose of Trileptal than prescribed. She is only taking Trileptal 300 mg at bedtime. She states taking it twice a day made her too tired to function at work.    In addition, the patients headaches have dramatically improved on indomethacin 25 mg PO BID. She is no longer having almost daily/weekly headaches. She says she has a severe bout of headache concerning for hemicrania continua only once every four months. However, she has been having blood in her stools intermittently. She has seen a G.I. specialist, who has performed a colonoscopy with no source discovered. An upper G.I. has not been performed.    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    She has a past surgical history that includes Total colectomy; Total colectomy; Hysterectomy; Tubal ligation; Bladder surgery; and Rectal prolapse repair.    Her family history includes Cancer in her father and mother; Diabetes in her father and mother; Heart disease in her father; Migraines in her daughter.    She reports that she has never smoked. She has never used smokeless tobacco. She reports that she drinks alcohol. She reports that she does not use drugs.      Review of Systems   Neurological: Positive for seizures.   All other systems reviewed and are negative.        Allergies:   Transderm-scop [scopolamine base]    Medications:     Outpatient Encounter Prescriptions as of 08/28/2016   Medication Sig Dispense Refill    indomethacin (INDOCIN) 25 MG capsule Take 1 capsule (25 mg total) by mouth 2 times a day  with meals. 180 capsule 3    LINACLOTIDE (LINZESS ORAL) Take by mouth.      omeprazole (PRILOSEC) 20 MG capsule Take 1 capsule (20 mg total) by mouth every morning before breakfast. 90 capsule 3    OXcarbazepine 300 mg Tb24 Take 300 mg by mouth at bedtime. Indications: SEIZURES 90 tablet 3    [DISCONTINUED] butalbital-aspirin-caffeine (FIORINAL) 50-325-40 mg Tab Take 1 tablet by mouth every 6 hours as needed.      [DISCONTINUED] indomethacin (INDOCIN) 25 MG capsule Take 1 capsule (25 mg total) by mouth 2 times a day with meals. 180 capsule 3    [DISCONTINUED] omeprazole (PRILOSEC) 20 MG capsule Take 1 capsule (20 mg total) by mouth every morning before breakfast. 90 capsule 3    [DISCONTINUED] OXcarbazepine (TRILEPTAL) 300 MG tablet Take 300 mg by mouth daily.      [DISCONTINUED] OXcarbazepine (TRILEPTAL) 300 MG tablet Take 1 tablet (300 mg total) by mouth 2 times a day. Indications: COMPLEX-PARTIAL EPILEPSY, SEIZURES 180 tablet 3     No facility-administered encounter medications on file as of 08/28/2016.         Objective:       Blood pressure 118/80, pulse 64, height 5' 2 (1.575 m), weight 127 lb (57.6 kg).    Neurologic Exam  Mental Status   Oriented to person, place, and time.   Registration:  Follows 3 step commands.   Attention: normal. Concentration:  normal.   Speech: speech is normal   Level of consciousness: alert   Knowledge: good. Able to perform simple calculations.   Able to name object. Able to read. Able to repeat. Able to write. Normal comprehension.   Cranial Nerves     CN III, IV, VI   Pupils are equal, round, and reactive to light.  Extraocular motions are normal.   Right pupil: Shape: regular. Reactivity: brisk. Consensual response: intact. Accommodation: intact.   Left pupil: Shape: regular. Reactivity: brisk. Consensual response: intact. Accommodation: intact.   CN III: no CN III palsy  CN VI: no CN VI palsy  Nystagmus: none   Ophthalmoparesis: none  Upgaze: normal  Downgaze:  normal  Conjugate gaze: present  Vestibulo-ocular reflex: present   CN V   Facial sensation intact.   CN VII   Facial expression full, symmetric.   CN VIII   CN VIII normal.   CN IX, X   CN IX normal.   CN X normal.   CN XI   CN XI normal.   CN XII   CN XII normal.   Motor Exam   Muscle bulk: normal  Overall muscle tone: normal  Right arm tone: normal  Left arm tone: normal  Right arm pronator drift: absent  Left arm pronator drift: absent  Right leg tone: normal  Left leg tone: normal   Strength   Strength 5/5 throughout.   Right neck flexion: 5/5   Left neck flexion: 5/5   Right neck extension: 5/5   Left neck extension: 5/5   Right deltoid: 5/5   Left deltoid: 5/5   Right biceps: 5/5   Left biceps: 5/5   Right triceps: 5/5   Left triceps: 5/5   Right wrist flexion: 5/5   Left wrist flexion: 5/5   Right wrist extension: 5/5   Left wrist extension: 5/5   Right interossei: 5/5   Left interossei: 5/5   Right abdominals: 5/5   Left abdominals: 5/5   Right iliopsoas: 5/5   Left iliopsoas: 5/5   Right quadriceps: 5/5   Left quadriceps: 5/5   Right hamstring: 5/5   Left hamstring: 5/5   Right glutei: 5/5   Left glutei: 5/5   Right anterior tibial: 5/5   Left anterior tibial: 5/5   Right posterior tibial: 5/5   Left posterior tibial: 5/5   Right peroneal: 5/5   Left peroneal: 5/5   Right gastroc: 5/5   Left gastroc: 5/5   Sensory Exam   Light touch normal.   Vibration normal.   Proprioception normal.   Pinprick normal.   Gait, Coordination, and Reflexes   Gait   Gait: normal   Coordination   Romberg: negative  Finger to nose coordination: normal  Heel to shin coordination: normal  Tandem walking coordination: normal   Reflexes   Right brachioradialis: 2+  Left brachioradialis: 2+  Right biceps: 2+  Left biceps: 2+  Right triceps: 2+  Left triceps: 2+  Right patellar: 2+  Left patellar: 2+  Right achilles: 2+  Left achilles: 2+  Right grip: 2+  Left grip: 2+  Right plantar: normal  Left plantar: normal   Physical  Exam  Constitutional: oriented to person, place, and time. Appears well-developed and well-nourished.   HENT:   Head: Normocephalic.   Eyes: EOM are normal. Pupils are equal, round, and reactive to light.   Neck: Normal range of motion.   Cardiovascular: Normal rate, regular rhythm and normal heart sounds.  Pulmonary/Chest: Effort normal and breath sounds normal.  Musculoskeletal: Normal range of motion.  Psychiatric: normal mood and affect. Speech is normal.     Assessment:     I. Focal onset seizures  II. 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 I was delighted to learn that shes been seizure free. It has not been quite two years since she last had any seizures. Once the two-year mark hits, we discussed potentially doing a workup to see if she absolutely has to stay on an antiseizure drug. The fact that she suffered no seizures on a substandard dose of an antiseizure drug would weigh favorably that it could be possible to successfully taper off Trileptal completely.    However, at this point, the patient is extremely hesitant and scared to come off her very low dose of an antiseizure drug. In order to make sure that what she is taking actually covers her 2/7, I recommended that she switch to the extended release formulation. She will still take only 300 mg daily given that she swears she is seizure free on this very low dose.    For now, we will continue Indomethacin. I would like the patient to get in contact with her G.I. specialist to make absolutely sure that an EGD is not warranted giving her the use of indomethacin and her new intermittent blood in the stools. I would like her to take omeprazole daily while she is on indomethacin.    I will plan to see the patient back in one year. Should questions or concerns arise in the interim, my medical assistant should be contacted.    I spent 25 minutes with the  patient, with the majority spent on counseling.

## 2016-09-01 MED ORDER — OXcarbazepine 300 mg Tb24
300 | ORAL_TABLET | Freq: Every evening | ORAL | 3 refills | Status: AC
Start: 2016-09-01 — End: ?

## 2016-09-01 NOTE — Telephone Encounter (Signed)
Noted, OV note sent to GI specialist.

## 2016-09-01 NOTE — Telephone Encounter (Signed)
Script signed. Also, please make sure a copy of the last note was sent to her GI specialist (that's what she is referrign to with the EGD).

## 2016-09-01 NOTE — Telephone Encounter (Signed)
Pt requests refill. Oxcarbazepine.300 mg, PO at bedtime. States pharmacy has not received the request. Also asks if the EGD referral was faxed over to Dr. Philis Nettle. Please advise.

## 2016-09-04 NOTE — Telephone Encounter (Signed)
Pt states she called her gastrologist today and was informed that they have yet to receive the referral, pt would like to know will the referral be faxed today? States if there are any questions call Dr. Philis Nettle at [phone]. Please advise pt once fax is completed.

## 2016-09-04 NOTE — Telephone Encounter (Signed)
I called spoke with pt and clarified with her that it was not a referral that MD was speaking about. Advised pt that he wanted to send GI physician a copy of office visit. No further questions or concerns. Call completed

## 2016-12-03 DIAGNOSIS — K922 Gastrointestinal hemorrhage, unspecified: Secondary | ICD-10-CM | POA: Insufficient documentation

## 2017-05-28 ENCOUNTER — Ambulatory Visit: Admit: 2017-05-28 | Discharge: 2017-06-01 | Payer: PRIVATE HEALTH INSURANCE

## 2017-05-28 DIAGNOSIS — K625 Hemorrhage of anus and rectum: Secondary | ICD-10-CM

## 2017-05-28 NOTE — Unmapped (Signed)
Chief Complaint   Patient presents with   ??? New Patient Visit/ Consultation   ??? Hemorrhoids     Colonoscopy 05/19/17     History of Present Illness    51 year old woman seen in consultation at the request of J Johannanigman for the evaluation of chronic rectal bleeding.    The patient had a total abdominal colectomy with ileorectal anastomosis by Dr. Luisa Dago in the remote past. This was done for colonic inertia. She had pelvic floor evaluation at that time, and tells me she did not have obstructive defecation syndrome. After that surgery however she developed pelvic floor prolapse, and had repair of bladder and rectal prolapse by Dr. Nyra Market, her gynecologist. She has alternating diarrhea and constipation, but denies the need to strain, or prolonged sitting. She senses that stool is stuck, and she can't empty unless she takes Linzess. In addition, ever since her colon is removed, she is always bloated. She has not had recent pelvic floor testing    Currently she describes passing maroon blood per rectum, in effect was admitted to [facility] for evaluation. 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 She was noticing squirting blood associated with bowel movements and more frequent, loose stools. She was also having left-sided and epigastric abdominal pain.    She was admitted and started on IV fluids, IV PPI. Serial hemoglobins were monitored and her hemoglobin dropped about 1 g, to 11.5 today. She was seen by GI and scoped, see Dr. Doristine Church notes for details. Significant findings included erosive gastroduodenitis, a tiny erosion seen near the ileorectal anastomosis site, and medium sized internal and small external hemorrhoids.    After her scope, the patient was  feeling better and very eager for discharge home. She is having very scant amounts of maroon rectal bleeding at this time but notes that it is much less than PTA. Her abdominal pain is improved as well. Per Dr. Thedore Mins, a high fiber diet and daily PPI are recommended. An abdominal ultrasound has been ordered as an outpatient. She knows to call for the results of this as well as today's biopsy results. She was also counseled regarding the avoidance of any further over the counter NSAIDs. She does take nightly indomethacin for a history of chronic migraine but says her doctor is planning to take her off of that soon. She was not taking a daily PPI before.    She does not smoke. She consumes occasional alcohol. Family history is significant for a mother with rectal cancer (my patient, Casey Harrington), and a biologic father with colorectal cancer. She is allergic to scopolamine. Other health problems include epilepsy and migraines                 Review of Systems   Constitutional: Negative for activity change, appetite change, chills, diaphoresis, fatigue, fever, weight gain and weight loss.   HENT: Negative for congestion, dental problem, drooling, ear discharge, ear pain, facial swelling, hearing loss, mouth sores, nosebleeds, postnasal drip, rhinorrhea, sinus pressure, sneezing, sore throat, tinnitus, trouble swallowing and voice change.    Eyes: Negative for photophobia, pain, discharge, redness, itching and visual disturbance.   Respiratory: Negative for apnea, cough, choking, chest tightness, shortness of breath, wheezing and stridor.  Cardiovascular: Negative for chest pain, palpitations and leg swelling.   Gastrointestinal: Positive for bloating, constipation, diarrhea and nausea. Negative for abdominal distention, abdominal pain, anal bleeding, blood in stool, heartburn, rectal pain and vomiting.   Genitourinary: Negative for decreased urine volume, difficulty urinating, dyspareunia, dysuria, enuresis, flank  pain, frequency, genital sores, hematuria, menstrual problem, nocturia, pelvic pain, urgency, vaginal bleeding, vaginal discharge and vaginal pain.   Musculoskeletal: Positive for back pain. Negative for arthralgias, gait problem, joint swelling, myalgias, neck pain and neck stiffness.   Skin: Negative for color change, pallor, rash and wound.   Neurological: Positive for dizziness and headaches. Negative for tremors, seizures, syncope, facial asymmetry, speech difficulty, weakness, light-headedness and numbness.   Hematological: Negative for adenopathy. Does not bruise/bleed easily.   Psychiatric/Behavioral: Positive for sleep disturbance. Negative for agitation, behavioral problems, confusion, decreased concentration, depression, dysphoric mood, hallucinations, self-injury and suicidal ideas. The patient is not nervous/anxious and is not hyperactive.        Allergies  Transderm-scop [scopolamine base]    Medications  Outpatient Encounter Prescriptions as of 05/28/2017   Medication Sig Dispense Refill   ??? indomethacin (INDOCIN) 25 MG capsule Take 1 capsule (25 mg total) by mouth 2 times a day with meals. 180 capsule 3   ??? LINACLOTIDE (LINZESS ORAL) Take by mouth.     ??? LINZESS 290 mcg Cap      ??? OXcarbazepine 300 mg Tb24 Take 300 mg by mouth at bedtime. Indications: SEIZURES 90 tablet 3   ??? pantoprazole (PROTONIX) 40 MG tablet      ??? [DISCONTINUED] OXcarbazepine (TRILEPTAL) 300 MG tablet      ??? omeprazole (PRILOSEC) 20 MG capsule Take 1 capsule (20 mg total) by mouth every morning before breakfast. 90 capsule 3     No facility-administered encounter medications on file as of 05/28/2017.         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    She has a past surgical history that includes Total colectomy; Total colectomy; Hysterectomy; Tubal ligation;  Bladder surgery; and Rectal prolapse repair.    Her family history includes Cancer in her father and mother; Diabetes in her father and mother; Heart disease in her father; Migraines in her daughter.    She reports that she has never smoked. She has never used smokeless tobacco. She reports that she drinks alcohol. She reports that she does not use drugs.    The following portions of the patient's history were reviewed and updated as appropriate: allergies, current medications, past family history, past medical history, past social history, past surgical history and problem list.    Blood pressure 108/76, pulse 65, height 5' 2 (1.575 m), weight 131 lb (59.4 kg), SpO2 99 %.  Physical Exam   Nursing note and vitals reviewed.  Constitutional: She appears well-developed and well-nourished.   HENT:   Head: Normocephalic and atraumatic.   Eyes: Pupils are equal, round, and reactive to light. No scleral icterus.   Neck: No tracheal deviation present.   Pulmonary/Chest: Effort normal. No stridor. No respiratory distress.   Genitourinary:   Genitourinary Comments: The perineum was slightly flattened. The anus gapes a bit. Digital rectal exam shows high resting and squeeze pressures, and a long high pressure zone. Proctoscopic exam shows healthy rectal mucosa, normal color solid stool, and erythema and excoriation of the internal hemorrhoids circumferentially. Straining  on the commode shows moderate perineal descent, swelling of the external cushions, no evidence of prolapse.   Neurological: She is alert. No cranial nerve deficit.   Skin: Skin is warm and dry.   Psychiatric: She has a normal mood and affect. Her behavior is normal. Judgment and thought content normal.          Assessment  My impression is that she has rectal bleeding, and erythematous internal hemorrhoids that do not prolapse. I would recommend IRC treatments to see if we can stop the immediate bleeding. However, maroon blood is more likely from a proximal  source; she was seen to have ulceration at her ileorectal anastomosis, not actively bleeding at the time of exam, but this certainly could be the source as well. If treatment of her hemorrhoids does stop the bleeding, I would recommend a PillCam to further investigate her small bowel. She tells me that she has left flank pain for the bleeding starts, and a CT scan was negative    At [facility], she was found to have esophagogastroduodenoscopy this, and was taking large doses of nonsteroidals for pain    Finally, I am concerned she has developed obstructive defecation syndrome, since she feels the stool gets stuck and will not come out    Plan  I would recommend defecography to evaluate for pelvic floor abnormalities, and obstructive defecation  Following this, I'll see her back in the office for infrared coagulation treatments  If this does not sound the bleeding, I would talk to Dr. Cheree Ditto, her gastroenterologist, regarding a PillCam for diagnostic purposes       Medical Decision Making  The following items were considered in medical decision making:  Discussion of care with consultant  Review / order clinical lab tests    Referring MD: Donnetta Hutching, MD    Patient Care Team:  Olga Coaster, MD as PCP - General (Internal Medicine)  Doyce Loose, MD (Internal Medicine)

## 2017-06-11 ENCOUNTER — Ambulatory Visit: Admit: 2017-06-11 | Discharge: 2017-06-11 | Payer: PRIVATE HEALTH INSURANCE

## 2017-06-11 DIAGNOSIS — K625 Hemorrhage of anus and rectum: Secondary | ICD-10-CM

## 2017-06-11 NOTE — Unmapped (Signed)
Chief Complaint   Patient presents with   ??? Hemorrhoids     IRC today        History of Present Illness    Casey Harrington had defecography at [facility] last week. This showed There is a small  anterior rectocele visualized during defecation. There is no rectal intussusception or prolapse. Diminished contractility of rectum. Over course of 4 fluoroscopy intervals of 20 seconds appease rectum incompletely emptied. After an   additional 30 seconds of straining and defecation near complete evacuation of rectal contents. Relatively low position of small bowel along the upper margin of the vaginal cuff but not extending into the rectovaginal space.   ??  ??  Impression:  ??  ??  1. Small anterior rectocele.  2. Diminished contractility of rectum with delayed emptying.    She continues to take with this, and tells me causes cramping pain that doubles her over at night, and she continues to feel as though stool is getting stuck  ??         Review of Systems   Constitutional: Negative for activity change, appetite change, chills, diaphoresis, fatigue, fever, weight gain and weight loss.   Eyes: Negative for photophobia, pain, discharge, redness, itching and visual disturbance.   Respiratory: Negative for apnea, cough, choking, chest tightness, shortness of breath, wheezing and stridor.    Cardiovascular: Negative for chest pain, palpitations and leg swelling.   Gastrointestinal: Positive for abdominal distention, abdominal pain, bloating and constipation. Negative for anal bleeding, blood in stool, diarrhea, heartburn, nausea, rectal pain and vomiting.   Musculoskeletal: Positive for back pain. Negative for arthralgias, gait problem, joint swelling and myalgias.   Neurological: Positive for headaches. Negative for dizziness, tremors, seizures, syncope, facial asymmetry, speech difficulty, weakness, light-headedness and numbness.   Hematological: Negative for adenopathy. Does not bruise/bleed easily.   Psychiatric/Behavioral:  Negative for depression. The patient is not nervous/anxious.        Allergies  Transderm-scop [scopolamine base]    Medications  Outpatient Encounter Prescriptions as of 06/11/2017   Medication Sig Dispense Refill   ??? indomethacin (INDOCIN) 25 MG capsule Take 1 capsule (25 mg total) by mouth 2 times a day with meals. 180 capsule 3   ??? LINACLOTIDE (LINZESS ORAL) Take by mouth.     ??? LINZESS 290 mcg Cap      ??? omeprazole (PRILOSEC) 20 MG capsule Take 1 capsule (20 mg total) by mouth every morning before breakfast. 90 capsule 3   ??? OXcarbazepine 300 mg Tb24 Take 300 mg by mouth at bedtime. Indications: SEIZURES 90 tablet 3   ??? pantoprazole (PROTONIX) 40 MG tablet        No facility-administered encounter medications on file as of 06/11/2017.         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    She has a past surgical history that includes Total colectomy; Total colectomy; Hysterectomy; Tubal ligation; Bladder surgery; and Rectal prolapse repair.    Her family history includes Cancer in her father and mother; Diabetes in her father and mother; Heart disease in her father; Migraines in her daughter.    She reports that she has never smoked. She has never used smokeless tobacco. She reports that she drinks alcohol. She reports that she does not use drugs.        Blood pressure 110/66, pulse 61, resp.  rate 16, height 5' 2 (1.575 m), weight 128 lb (58.1 kg).  Physical Exam   Genitourinary:   Genitourinary Comments: She was placed in the prone flexed position. Casey Harrington was in attendance. I inserted the small anoscope, and performed an ERCP on the right anterior and left internal hemorrhoids, with good blanching of the mucosa          Assessment  My impression is that she has diminished rectal contractility, consistent with a prior history of colonic inertia.  She did great deal of difficulty evacuating her rectum during defecography. I told her there is not an operation that can fix delayed motility.    Today I performed infrared coagulation of internal hemorrhoids, to take the hemorrhoids out of the picture should she develop recurrent GI bleeding.    I would recommend an English bowel regimen, with the use of a Dulcolax suppository every morning, to see if this may promote improved rectal contraction and ease of evacuation    Plan  I will see her back in 6-8 weeks to check on whether there is any improvement with use of a daily suppository. If she develops any rectal bleeding, she will call for evaluation       Medical Decision Making  The following items were considered in medical decision making:  I obtained records from the [facility]  I reviewed the radiology reports from the outside hospital  Review of radiology images from an outside hospital    Obtain records and history from outside facility/provider  Review / order radiology tests  Reviewed outside records  Independent review of radiologic images    Referring MD: Referral, Self    Patient Care Team:  Olga Coaster, MD as PCP - General (Internal Medicine)  Doyce Loose, MD (Internal Medicine)

## 2017-07-23 ENCOUNTER — Ambulatory Visit: Payer: PRIVATE HEALTH INSURANCE

## 2018-10-24 DIAGNOSIS — J0140 Acute pansinusitis, unspecified: Secondary | ICD-10-CM | POA: Diagnosis not present

## 2018-12-03 ENCOUNTER — Ambulatory Visit: Payer: 59 | Admitting: Family Medicine

## 2018-12-03 ENCOUNTER — Encounter: Payer: Self-pay | Admitting: Family Medicine

## 2018-12-03 VITALS — BP 110/64 | HR 68 | Temp 97.7°F | Ht 61.5 in | Wt 129.9 lb

## 2018-12-03 DIAGNOSIS — K5900 Constipation, unspecified: Secondary | ICD-10-CM

## 2018-12-03 DIAGNOSIS — Z13220 Encounter for screening for lipoid disorders: Secondary | ICD-10-CM

## 2018-12-03 DIAGNOSIS — R5383 Other fatigue: Secondary | ICD-10-CM

## 2018-12-03 DIAGNOSIS — G40909 Epilepsy, unspecified, not intractable, without status epilepticus: Secondary | ICD-10-CM | POA: Diagnosis not present

## 2018-12-03 DIAGNOSIS — G43909 Migraine, unspecified, not intractable, without status migrainosus: Secondary | ICD-10-CM

## 2018-12-03 DIAGNOSIS — Z86718 Personal history of other venous thrombosis and embolism: Secondary | ICD-10-CM | POA: Diagnosis not present

## 2018-12-03 DIAGNOSIS — K922 Gastrointestinal hemorrhage, unspecified: Secondary | ICD-10-CM

## 2018-12-03 DIAGNOSIS — L309 Dermatitis, unspecified: Secondary | ICD-10-CM | POA: Diagnosis not present

## 2018-12-03 MED ORDER — LINACLOTIDE 290 MCG PO CAPS: 290.0000 ug | ORAL_CAPSULE | Freq: Every day | ORAL | 5 refills | Status: DC

## 2018-12-03 NOTE — Patient Instructions (Signed)
Let me know what migraine medications you used previously.

## 2018-12-03 NOTE — Progress Notes (Addendum)
Cheryl James DOB: 1965-12-06 Encounter date: 12/03/2018  This is a 53 y.o. female who presents to establish care. Chief Complaint  Patient presents with  . New Patient (Initial Visit)    referral to dermatology and GI    History of present illness: Moved here last October so establishing care.   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 Has stomach issues daily. Using heating pad.   Has hx of epilepsy. Started when she was in 3rd grade. Initially would have black out (as child) and fall down. Then as adult had staring type seizure. Hx of migraine. Has been off of the trileptal now for 15 months without seizure. Migraines are coming a little more often; she feels these are stress related. Getting these 1-2 times/month (used to come more daily). Sensitivity to light, sound, nausea. Can vomit. Lies down in dark room with rag on face.   When she got here she found obgyn and after exam they recommended hormonal replacement. She hasn't noted difference yet. Started her on lower dose.   Past Medical History:  Diagnosis Date  . Constipation   . Epilepsy (HCC)   . Migraine    Past Surgical History:  Procedure Laterality Date  . BREAST BIOPSY  2000   benign  . CERVICAL CONE BIOPSY  1992  . CYSTOCELE REPAIR  2005  . RECTOCELE REPAIR  2005  . TOTAL COLECTOMY  2002  . TUBAL LIGATION  1995  . VAGINAL HYSTERECTOMY  2005   Allergies  Allergen Reactions  . Other     Motion sickness patches.   Current Meds  Medication Sig  . estrogens, conjugated, (PREMARIN) 0.3 MG tablet Take 0.3 mg by mouth daily. Take daily for 21 days then do not take for 7 days.   Social History   Tobacco Use  . Smoking status: Never Smoker  . Smokeless tobacco: Never Used  Substance Use Topics  . Alcohol use: Not on file   Family History  Problem Relation Age of Onset  . Cancer Mother        cervical  . Rectal cancer Mother   . Alcohol abuse  Mother   . Arthritis Mother   . Hearing loss Mother        Meniere's disease  . Alcohol abuse Father   . Lung cancer Father   . Depression Father   . Heart attack Father 19  . Mental illness Father        PTSD, agoraphobia  . Heart attack Maternal Grandfather 55  . Heart attack Paternal Grandmother 90  . Leukemia Paternal Grandfather   . Alcohol abuse Sister   . Depression Sister   . Drug abuse Sister   . Depression Daughter      Review of Systems  Constitutional: Positive for fatigue (got more tired with less working out). Negative for chills and fever.  Respiratory: Negative for cough, chest tightness, shortness of breath and wheezing.   Cardiovascular: Negative for chest pain, palpitations and leg swelling.    Objective:  BP 110/64 (BP Location: Left Arm, Patient Position: Sitting, Cuff Size: Normal)   Pulse 68   Temp 97.7 F (36.5 C) (Oral)   Ht 5' 1.5" (1.562 m)   Wt 129 lb 14.4 oz (58.9 kg)   SpO2 98%   BMI 24.15 kg/m   Weight: 129 lb 14.4 oz (58.9 kg)   BP Readings from Last 3 Encounters:  12/03/18 110/64  Wt Readings from Last 3 Encounters:  12/03/18 129 lb 14.4 oz (58.9 kg)    Physical Exam Constitutional:      General: She is not in acute distress.    Appearance: She is well-developed.  Cardiovascular:     Rate and Rhythm: Normal rate and regular rhythm.     Heart sounds: Normal heart sounds. No murmur. No friction rub.  Pulmonary:     Effort: Pulmonary effort is normal. No respiratory distress.     Breath sounds: Normal breath sounds. No wheezing or rales.  Abdominal:     General: Abdomen is flat. Bowel sounds are normal. There is no distension.  Musculoskeletal:     Right lower leg: No edema.     Left lower leg: No edema.  Neurological:     Mental Status: She is alert and oriented to person, place, and time.  Psychiatric:        Behavior: Behavior normal.    Depression screen [facility] 2/9 12/06/2018  Decreased Interest 0  Down, Depressed,  Hopeless 0  PHQ - 2 Score 0  Altered sleeping 1  Tired, decreased energy 1  Change in appetite 0  Feeling bad or failure about yourself  0  Trouble concentrating 0  Moving slowly or fidgety/restless 0  Suicidal thoughts 0  PHQ-9 Score 2  Difficult doing work/chores Not difficult at all    Assessment/Plan:  1. History of DVT (deep vein thrombosis) States that prior work up negative.  2. Gastrointestinal hemorrhage, unspecified gastrointestinal hemorrhage type History of hemorrhage. Avoid irritants.  - Ambulatory referral to Gastroenterology  3. Nonintractable epilepsy without status epilepticus, unspecified epilepsy type [facility]) Not on medication. No seizure off medication x 15 months. - Ambulatory referral to Neurology  4. Migraine without status migrainosus, not intractable, unspecified migraine type She will text with previous medication. - Ambulatory referral to Neurology  5. Eczema, unspecified type  - Ambulatory referral to Dermatology  6. Fatigue, unspecified type  - CBC with Differential/Platelet; Future - Comprehensive metabolic panel; Future - TSH; Future - Vitamin B12; Future - VITAMIN D 25 Hydroxy (Vit-D Deficiency, Fractures); Future - VITAMIN D 25 Hydroxy (Vit-D Deficiency, Fractures) - Vitamin B12 - TSH - Comprehensive metabolic panel - CBC with Differential/Platelet - Iron, TIBC and Ferritin Panel  7. Lipid screening  - Lipid panel; Future - Lipid panel   Return pending bloodwork.  Theodis Shove, MD

## 2018-12-04 LAB — CBC WITH DIFFERENTIAL/PLATELET
Absolute Monocytes: 454 cells/uL (ref 200–950)
Basophils Absolute: 38 cells/uL (ref 0–200)
Basophils Relative: 0.7 %
Eosinophils Absolute: 92 cells/uL (ref 15–500)
Eosinophils Relative: 1.7 %
HCT: 40.3 % (ref 35.0–45.0)
Hemoglobin: 13.9 g/dL (ref 11.7–15.5)
Lymphs Abs: 1588 cells/uL (ref 850–3900)
MCH: 30.5 pg (ref 27.0–33.0)
MCHC: 34.5 g/dL (ref 32.0–36.0)
MCV: 88.6 fL (ref 80.0–100.0)
MONOS PCT: 8.4 %
MPV: 9.5 fL (ref 7.5–12.5)
NEUTROS ABS: 3229 {cells}/uL (ref 1500–7800)
Neutrophils Relative %: 59.8 %
Platelets: 291 10*3/uL (ref 140–400)
RBC: 4.55 10*6/uL (ref 3.80–5.10)
RDW: 12.5 % (ref 11.0–15.0)
Total Lymphocyte: 29.4 %
WBC: 5.4 10*3/uL (ref 3.8–10.8)

## 2018-12-04 LAB — COMPREHENSIVE METABOLIC PANEL
AG Ratio: 1.9 (calc) (ref 1.0–2.5)
ALT: 19 U/L (ref 6–29)
AST: 22 U/L (ref 10–35)
Albumin: 4.5 g/dL (ref 3.6–5.1)
Alkaline phosphatase (APISO): 45 U/L (ref 33–130)
BUN: 15 mg/dL (ref 7–25)
CO2: 24 mmol/L (ref 20–32)
Calcium: 9.4 mg/dL (ref 8.6–10.4)
Chloride: 103 mmol/L (ref 98–110)
Creat: 0.8 mg/dL (ref 0.50–1.05)
Globulin: 2.4 g/dL (calc) (ref 1.9–3.7)
Glucose, Bld: 82 mg/dL (ref 65–99)
Potassium: 4.1 mmol/L (ref 3.5–5.3)
Sodium: 139 mmol/L (ref 135–146)
Total Bilirubin: 2 mg/dL — ABNORMAL HIGH (ref 0.2–1.2)
Total Protein: 6.9 g/dL (ref 6.1–8.1)

## 2018-12-04 LAB — IRON, TOTAL/TOTAL IRON BINDING CAP
%SAT: 44 % (calc) (ref 16–45)
IRON: 169 ug/dL — AB (ref 45–160)
TIBC: 380 mcg/dL (calc) (ref 250–450)

## 2018-12-04 LAB — LIPID PANEL
Cholesterol: 257 mg/dL — ABNORMAL HIGH (ref <200)
HDL: 98 mg/dL (ref >50)
LDL Cholesterol (Calc): 143 mg/dL (calc) — ABNORMAL HIGH
Non-HDL Cholesterol (Calc): 159 mg/dL (calc) — ABNORMAL HIGH (ref <130)
Total CHOL/HDL Ratio: 2.6 (calc) (ref <5.0)
Triglycerides: 64 mg/dL (ref <150)

## 2018-12-04 LAB — VITAMIN B12: Vitamin B-12: 732 pg/mL (ref 200–1100)

## 2018-12-04 LAB — VITAMIN D 25 HYDROXY (VIT D DEFICIENCY, FRACTURES): Vit D, 25-Hydroxy: 40 ng/mL (ref 30–100)

## 2018-12-04 LAB — TSH: TSH: 4.19 mIU/L

## 2018-12-04 LAB — FERRITIN: Ferritin: 24 ng/mL (ref 16–232)

## 2018-12-06 ENCOUNTER — Encounter: Payer: Self-pay | Admitting: Neurology

## 2018-12-07 ENCOUNTER — Other Ambulatory Visit: Payer: Self-pay | Admitting: Family Medicine

## 2018-12-07 ENCOUNTER — Encounter: Payer: Self-pay | Admitting: Family Medicine

## 2018-12-07 DIAGNOSIS — R17 Unspecified jaundice: Secondary | ICD-10-CM

## 2018-12-13 ENCOUNTER — Encounter: Payer: Self-pay | Admitting: Gastroenterology

## 2018-12-28 ENCOUNTER — Other Ambulatory Visit (INDEPENDENT_AMBULATORY_CARE_PROVIDER_SITE_OTHER): Payer: 59

## 2018-12-28 DIAGNOSIS — R17 Unspecified jaundice: Secondary | ICD-10-CM | POA: Diagnosis not present

## 2018-12-29 LAB — COMPREHENSIVE METABOLIC PANEL
ALT: 13 U/L (ref 0–35)
AST: 18 U/L (ref 0–37)
Albumin: 4.6 g/dL (ref 3.5–5.2)
Alkaline Phosphatase: 42 U/L (ref 39–117)
BUN: 11 mg/dL (ref 6–23)
CO2: 29 mEq/L (ref 19–32)
Calcium: 9.5 mg/dL (ref 8.4–10.5)
Chloride: 102 mEq/L (ref 96–112)
Creatinine, Ser: 0.87 mg/dL (ref 0.40–1.20)
GFR: 68.55 mL/min (ref >60.00)
Glucose, Bld: 99 mg/dL (ref 70–99)
Potassium: 4.6 mEq/L (ref 3.5–5.1)
Sodium: 140 mEq/L (ref 135–145)
Total Bilirubin: 1.7 mg/dL — ABNORMAL HIGH (ref 0.2–1.2)
Total Protein: 6.7 g/dL (ref 6.0–8.3)

## 2019-01-10 ENCOUNTER — Encounter: Payer: Self-pay | Admitting: Gastroenterology

## 2019-01-10 ENCOUNTER — Other Ambulatory Visit: Payer: 59

## 2019-01-10 ENCOUNTER — Ambulatory Visit: Payer: 59 | Admitting: Gastroenterology

## 2019-01-10 VITALS — BP 92/60 | HR 80 | Ht 61.5 in | Wt 130.0 lb

## 2019-01-10 DIAGNOSIS — D500 Iron deficiency anemia secondary to blood loss (chronic): Secondary | ICD-10-CM | POA: Diagnosis not present

## 2019-01-10 DIAGNOSIS — K5900 Constipation, unspecified: Secondary | ICD-10-CM

## 2019-01-10 MED ORDER — LINACLOTIDE 290 MCG PO CAPS: 290.0000 ug | ORAL_CAPSULE | Freq: Every day | ORAL | 5 refills | Status: DC

## 2019-01-10 NOTE — Progress Notes (Signed)
Referring Provider: Wynn BankerKoberlein, Junell C, MD Primary Care Physician:  Wynn BankerKoberlein, Junell C, MD   Reason for Consultation:  Establish GI care   IMPRESSION:  Chronic constipation with associated left lower quadrant pain and bloating    -Some improvement with Linzess    -Uses hot baths and heating pad nightly for control of her symptoms Subtotal colectomy for colonic inertia 2003    - Extensively evaluated in [place]    - Adequately controlled on Linzess Unexplained intermittent GI blood loss with associated anemia    - Multiple EGDs and colonoscopies per the patient report    - No prior capsule endoscopy Hyperbilirubinemia    - No prior knowledge of elevated bilirubin levels    - No history of jaundice    -Labs from 12/03/2018 show a total bilirubin of 2.0, 12/28/2018 show a total bilirubin of 1.7 Hemorrhoids status post banding Multiple abdominal and gynecologic surgeries: prolapsed bladder and rectum, hysterectomy, colectomy Known rectocele Last colonoscopy 2018  We will obtain her prior records regarding evaluation and management history from her gastroenterologist in [place].  We will particularly evaluate if there is additional assessment or treatment options available for her constipation.  Continue Linzess at this time.  Reviewed dietary modifications that may minimize bloating associated with constipation.  She continues to have recurrent unexplained GI bleeding.  We will review her records from [place] regarding her prior evaluation.  A capsule endoscopy may be indicated.  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  I briefly discussed the diagnosis with the patient.  PLAN: Will obtain prior records from GI in [place], [place] [facility], GI was Dr. 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 Hassan RowanKoberlein to establish GI care and evaluate hyperbilirubinemia.  The history is obtained to the patient and review of her electronic health record.  She has history of chronic headaches, anal fissure, right leg DVT, and seizures.  2003 she had a near total colectomy for colonic inertia. 2006 hysterectomy, prolapsed bladder and rectum, blood transfusion for bleeding Intermittent bleeding since then. Multiple EGD, Colon. No capsule endoscopy. Prior evaluation with Bl[facility] study. Constipation and associated bloating controlled on Linzess since 2014. Still some bloat but not as bad with Linzess.  Trial of [place], suppositories. Avoid meat, gas-rpoducing foods, dairy.  2018 bleeding with defecation.  She reports a full endoscopic evaluation at that time.  However, it is unclear if she is ever had a capsule endoscopy. Two days of bleeding since [place] to [place] in October 2018.  She has manage the symptoms at home as she is unsure if there is anything else that could be done to identify a source or provide treatment.  Left-sided bloating. Post-prandial worsening 60 minutes after eating. Unable to pass gas as eructation or fluctation.  Hemorrhoidal banding for internal hemorrhoids. Rectocele that can't be treated. Loose stools when she actually defecates.   Hot bath and heating pad to the LLQ every night before bed. Calms her down enough to sleep as she is not focusing on it. Symptoms wake her in the morning.   No Carbonated beverages. Coffee every morning with stevia. Green tea at night. Avoids Lactose. Worsened gluten. No Fructose in her diet.  Already avoids gas-producing foods such as legumes, onions, cabbage, [address]  sprouts, and artificial sweeteners.  Although she does use Stevia in her coffee each morning.  Maternal aunt with rectal caancer.  No other known family history of  colon cancer or polyps.   Past Medical History:  Diagnosis Date  . Constipation   . Epilepsy (HCC)   . Migraine     Past Surgical History:  Procedure Laterality Date  . BREAST BIOPSY  2000   benign  . CERVICAL CONE BIOPSY  1992  . CYSTOCELE REPAIR  2005  . RECTOCELE REPAIR  2005  . TOTAL COLECTOMY  2002  . TUBAL LIGATION  1995  . VAGINAL HYSTERECTOMY  2005    Current Outpatient Medications  Medication Sig Dispense Refill  . estrogens, conjugated, (PREMARIN) 0.3 MG tablet Take 0.3 mg by mouth daily. Take daily for 21 days then do not take for 7 days.    Marland Kitchen FIBER ADULT GUMMIES PO Take by mouth daily.    Marland Kitchen linaclotide (LINZESS) 290 MCG CAPS capsule Take 1 capsule (290 mcg total) by mouth daily before breakfast. 30 capsule 5  . Multiple Vitamin (MULTIVITAMIN) tablet Take 1 tablet by mouth daily.    . indomethacin (INDOCIN) 25 MG capsule Take by mouth.     No current facility-administered medications for this visit.     Allergies as of 01/10/2019 - Review Complete 01/10/2019  Allergen Reaction Noted  . Other  12/03/2018    Family History  Problem Relation Age of Onset  . Cancer Mother        cervical  . Rectal cancer Mother   . Alcohol abuse Mother   . Arthritis Mother   . Hearing loss Mother        Meniere's disease  . Alcohol abuse Father   . Lung cancer Father   . Depression Father   . Heart attack Father 34  . Mental illness Father        PTSD, agoraphobia  . Heart attack Maternal Grandfather 55  . Heart attack Paternal Grandmother 90  . Leukemia Paternal Grandfather   . Alcohol abuse Sister   . Depression Sister   . Drug abuse Sister   . Depression Daughter   . Colon cancer Maternal Aunt     Social History   Socioeconomic History  . Marital status: Married    Spouse name: Not on file  . Number of children: Not on file  . Years of education: Not on file  . Highest education level: Not on file  Occupational History  . Not on file  Social Needs  .  Financial resource strain: Not on file  . Food insecurity:    Worry: Not on file    Inability: Not on file  . Transportation needs:    Medical: Not on file    Non-medical: Not on file  Tobacco Use  . Smoking status: Never Smoker  . Smokeless tobacco: Never Used  Substance and Sexual Activity  . Alcohol use: Yes    Comment: 1 weekend  . Drug use: Never  . Sexual activity: Yes  Lifestyle  . Physical activity:    Days per week: Not on file    Minutes per session: Not on file  . Stress: Not on file  Relationships  . Social connections:    Talks on phone: Not on file    Gets together: Not on file    Attends religious service: Not on file    Active member of club or organization: Not on file    Attends  meetings of clubs or organizations: Not on file    Relationship status: Not on file  . Intimate partner violence:    Fear of current or ex partner: Not on file    Emotionally abused: Not on file    Physically abused: Not on file    Forced sexual activity: Not on file  Other Topics Concern  . Not on file  Social History Narrative  . Not on file    Review of Systems: 12 system ROS is negative except as noted above with the additions of back pain, fatigue, headaches, and eczema on both legs.[place]ar [place] Weights   01/10/19 1553  Weight: 130 lb (59 kg)    Physical Exam: Vital signs were reviewed. General:   Alert, well-nourished, pleasant and cooperative in NAD Head:  Normocephalic and atraumatic. Eyes:  Sclera clear, no icterus.   Conjunctiva pink. Mouth:  No deformity or lesions.   Neck:  Supple; no thyromegaly. Lungs:  Clear throughout to auscultation.   No wheezes.  Heart:  Regular rate and rhythm; no murmurs Abdomen:  Soft, nontender, no asymmetry, well-healed surgical scars, normal bowel sounds. No rebound or guarding. No hepatosplenomegaly Rectal:  Deferred  Msk:  Symmetrical without gross deformities. Extremities:  No gross deformities or edema. Neurologic:  Alert and   oriented x4;  grossly nonfocal Skin:  No rash or bruise. Psych:  Alert and cooperative. Normal mood and affect.   Clodagh Odenthal L. Orvan Falconer, MD, MPH [facility] 01/11/2019, 3:55 PM

## 2019-01-10 NOTE — Patient Instructions (Signed)
Your provider has requested that you go to the basement level for lab work before leaving today. Press "B" on the elevator. The lab is located at the first door on the left as you exit the elevator.  Continue Linzess 290 mcg daily.   Avoid artificial sweeteners.

## 2019-01-11 ENCOUNTER — Telehealth: Payer: Self-pay | Admitting: Gastroenterology

## 2019-01-11 NOTE — Telephone Encounter (Signed)
Cheryl James can you look into this?

## 2019-01-11 NOTE — Telephone Encounter (Signed)
Pt came in for a visit yesterday was told to go have some lab work done. Pt went and was told that the lab order was sent incorrectly.

## 2019-01-11 NOTE — Telephone Encounter (Signed)
Pts voicemail is full. Left message on husbands voicemail for her to call the office.

## 2019-01-14 NOTE — Telephone Encounter (Signed)
Spoke to patient and informed her to come back to have labwork done. She expressed understanding and will try to come next week.

## 2019-01-17 ENCOUNTER — Other Ambulatory Visit: Payer: 59

## 2019-01-18 LAB — BILIRUBIN, FRACTIONATED(TOT/DIR/INDIR)
BILIRUBIN INDIRECT: 1.2 mg/dL (ref 0.2–1.2)
Bilirubin, Direct: 0.2 mg/dL (ref 0.0–0.2)
Total Bilirubin: 1.4 mg/dL — ABNORMAL HIGH (ref 0.2–1.2)

## 2019-02-14 ENCOUNTER — Ambulatory Visit: Payer: 59 | Admitting: Neurology

## 2019-04-22 ENCOUNTER — Other Ambulatory Visit: Payer: Self-pay

## 2019-04-22 ENCOUNTER — Encounter: Payer: Self-pay | Admitting: Neurology

## 2019-04-22 ENCOUNTER — Telehealth (INDEPENDENT_AMBULATORY_CARE_PROVIDER_SITE_OTHER): Payer: 59 | Admitting: Neurology

## 2019-04-22 VITALS — Ht 62.0 in | Wt 128.0 lb

## 2019-04-22 DIAGNOSIS — G4451 Hemicrania continua: Secondary | ICD-10-CM | POA: Diagnosis not present

## 2019-04-22 DIAGNOSIS — G40909 Epilepsy, unspecified, not intractable, without status epilepticus: Secondary | ICD-10-CM

## 2019-04-22 MED ORDER — TOPIRAMATE 25 MG PO TABS: ORAL_TABLET | ORAL | 0 refills | Status: DC

## 2019-04-22 MED ORDER — SUMATRIPTAN SUCCINATE 100 MG PO TABS: ORAL_TABLET | ORAL | 2 refills | Status: DC

## 2019-04-22 NOTE — Progress Notes (Signed)
Virtual Visit via Video Note The purpose of this virtual visit is to provide medical care while limiting exposure to the novel coronavirus.    Consent was obtained for video visit:  Yes.   Answered questions that patient had about telehealth interaction:  Yes.   I discussed the limitations, risks, security and privacy concerns of performing an evaluation and management service by telemedicine. I also discussed with the patient that there may be a patient responsible charge related to this service. The patient expressed understanding and agreed to proceed.  Pt location: Home Physician Location: office Name of referring provider:  Wynn Banker, MD I connected with Cheryl James at patients initiation/request on 04/22/2019 at 10:50 AM EDT by video enabled telemedicine application and verified that I am speaking with the correct person using two identifiers. Pt MRN:  [id number] Pt DOB:  02-24-1966 Video Participants:  Cheryl James   History of Present Illness:  Cheryl James is a 53 year old woman who presents for migraine and seizure disorder.  History supplemented by referring provider note.  Cheryl James moved to [place] from [place] about 1 1/2 years ago.  She was treated by a neurologist in [place] for migraine and seizure disorder.  She has had migraines since her 43s.  They are 7/10 right sided (primarily frontal/perioribital) throbbing pain associated with right ptosis, rhinorrhea, nausea, photophobia, phonophobia, and sometimes vomiting (but no associated unilateral numbness or weakness).  Initially, they would last 1-2 days and occur 3 times a week, therefore she had near daily headaches.  Emotional stress is a trigger.  Laying down in the dark with a cold rag on her face helped relieve them.  In [place], she was started on indomethacin and headaches resolved.  She also has history of epilepsy since third grade.  They presented as black out spells in which she would  lose consciousness and fall to the floor.  They subsequently resolved.  In her 8s, she had a recurrence of seizures presenting as brief staring spells.  She was having about 3 a day.  She was started on oxcarbazepine and seizures resolved.  Past antiepileptic medications included phenobarbital in childhood and carbamazepine in adulthood.  Prior workup included MRI and EEG.  Since moving to [place], she has not been on indomethacin or oxcarbazepine.  While she still hasn't had a recurrent seizure, she has had a recurrence of headache, now lasting 3 days and therefore near daily.  Last seizure was about 5 years ago.  Current NSAIDS:  none Current analgesics:  Store-brand migraine medication Current triptans:  none Current ergotamine:  none Current anti-emetic:  none Current muscle relaxants:  none Current anti-anxiolytic:  none Current sleep aide:  none Current Antihypertensive medications:  none Current Antidepressant medications:  none Current Anticonvulsant medications:  none Current anti-CGRP:  none Current Antihistamines/Decongestants:  none Other therapy:  none Hormone/birth control:  Premarin  Past NSAIDS:  Indomethacin daily (effective) Past analgesics:  Excedrin, Fioricet Past abortive triptans:  none Past abortive ergotamine:  none Past muscle relaxants:  none Past anti-emetic:  none Past antihypertensive medications:  none Past antidepressant medications:  none Past anticonvulsant medications:  none Past anti-CGRP:  none Other past therapies:  Cup of coffee (ineffective)  Caffeine:  1 cup of coffee daily.  1 cup sleepy tea at night. Alcohol:  1 glass of wine every now and then Smoker:  no Diet:  Hydrates.   Exercise:  routine Depression:  no; Anxiety:  no Other  pain:  Chronic abdominal pain Sleep hygiene:  Okay when without headache Family history of headache:  Sister (migraine), mom  LABS: 12/28/18 CMP:  Na 140, K 4.6, Cl 102, CO2 29, glucose 99, BUN 11,  Cr 0.87, t bili 1.7, ALP 42, AST 18, ALT 13 12/03/18 CBC:  WBC 5.4, HGB 13.9, HCT 40.3, PLT 291  Past Medical History: Past Medical History:  Diagnosis Date  . Constipation   . Epilepsy (HCC)   . Migraine     Medications: Outpatient Encounter Medications as of 04/22/2019  Medication Sig  . estrogens, conjugated, (PREMARIN) 0.3 MG tablet Take 0.3 mg by mouth daily. Take daily for 21 days then do not take for 7 days.  Marland Kitchen FIBER ADULT GUMMIES PO Take by mouth daily.  . indomethacin (INDOCIN) 25 MG capsule Take by mouth.  . linaclotide (LINZESS) 290 MCG CAPS capsule Take 1 capsule (290 mcg total) by mouth daily before breakfast.  . Multiple Vitamin (MULTIVITAMIN) tablet Take 1 tablet by mouth daily.   No facility-administered encounter medications on file as of 04/22/2019.     Allergies: Allergies  Allergen Reactions  . Other     Motion sickness patches.    Family History: Family History  Problem Relation Age of Onset  . Cancer Mother        cervical  . Rectal cancer Mother   . Alcohol abuse Mother   . Arthritis Mother   . Hearing loss Mother        Meniere's disease  . Alcohol abuse Father   . Lung cancer Father   . Depression Father   . Heart attack Father 70  . Mental illness Father        PTSD, agoraphobia  . Heart attack Maternal Grandfather 55  . Heart attack Paternal Grandmother 90  . Leukemia Paternal Grandfather   . Alcohol abuse Sister   . Depression Sister   . Drug abuse Sister   . Depression Daughter   . Colon cancer Maternal Aunt     Social History: Social History   Socioeconomic History  . Marital status: Married    Spouse name: Not on file  . Number of children: Not on file  . Years of education: Not on file  . Highest education level: Not on file  Occupational History  . Not on file  Social Needs  . Financial resource strain: Not on file  . Food insecurity:    Worry: Not on file    Inability: Not on file  . Transportation needs:     Medical: Not on file    Non-medical: Not on file  Tobacco Use  . Smoking status: Never Smoker  . Smokeless tobacco: Never Used  Substance and Sexual Activity  . Alcohol use: Yes    Comment: 1 weekend  . Drug use: Never  . Sexual activity: Yes  Lifestyle  . Physical activity:    Days per week: Not on file    Minutes per session: Not on file  . Stress: Not on file  Relationships  . Social connections:    Talks on phone: Not on file    Gets together: Not on file    Attends religious service: Not on file    Active member of club or organization: Not on file    Attends meetings of clubs or organizations: Not on file    Relationship status: Not on file  . Intimate partner violence:    Fear of current or ex partner: Not on  file    Emotionally abused: Not on file    Physically abused: Not on file    Forced sexual activity: Not on file  Other Topics Concern  . Not on file  Social History Narrative  . Not on file   Observations/Objective:   Height 5\' 2"  (1.575 m), weight 128 lb (58.1 kg). No acute distress.  Alert and oriented.  Speech fluent and not dysarthric.  Language intact.  Face symmetric.  Assessment and Plan:   1.  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  Given her history of GI issues (chronic abdominal pain, rectal bleeding), we will try topiramate (as it also is treatment for seizure prophylaxis). 2.  Seizure disorder.  While she has been seizure-free since discontinuation of antiepileptic medication, I would recommend continuing prophylactic therapy as she did continue to have seizures into adulthood.  1.  We will start topiramate 25mg  at bedtime for one week, then 25mg  twice daily for one week, then 50mg  twice daily. 2.  For headache abortive therapy, she will try sumatriptan 100mg  3.  Limit use of pain relievers to no more than 2 days out of week to prevent risk of rebound or medication-overuse headache. 4.   Follow up in 4 months.  Follow Up Instructions:    -I discussed the assessment and treatment plan with the patient. The patient was provided an opportunity to ask questions and all were answered. The patient agreed with the plan and demonstrated an understanding of the instructions.   The patient was advised to call back or seek an in-person evaluation if the symptoms worsen or if the condition fails to improve as anticipated.   Cira ServantAdam Robert , DO

## 2019-04-29 ENCOUNTER — Ambulatory Visit: Payer: 59 | Admitting: Neurology

## 2019-05-30 ENCOUNTER — Other Ambulatory Visit: Payer: Self-pay

## 2019-05-30 ENCOUNTER — Telehealth: Payer: Self-pay | Admitting: *Deleted

## 2019-05-30 DIAGNOSIS — G40909 Epilepsy, unspecified, not intractable, without status epilepticus: Secondary | ICD-10-CM

## 2019-05-30 NOTE — Telephone Encounter (Signed)
VMOM to call to set up sleep deprived EEG. Offered this coming Wednesday 06-01-19 or Thursday7-9-20 at 7:30am

## 2019-05-31 ENCOUNTER — Other Ambulatory Visit: Payer: Self-pay

## 2019-05-31 MED ORDER — INDOMETHACIN 25 MG PO CAPS: 25.0000 mg | ORAL_CAPSULE | Freq: Every day | ORAL | 3 refills | Status: DC

## 2019-06-01 ENCOUNTER — Telehealth: Payer: Self-pay | Admitting: *Deleted

## 2019-06-01 NOTE — Telephone Encounter (Signed)
Spoke with Cheryl James about being up al night and planning on being here for 1.5 hours. She will call back I offered 07/15 @7 :30 . She needs to ask off work for this because It is sleep deprived.

## 2019-06-06 ENCOUNTER — Telehealth: Payer: Self-pay | Admitting: *Deleted

## 2019-06-06 NOTE — Telephone Encounter (Signed)
Called and left message asking if she had a chance to talk to work about her being off for EEG. I informed her at this point the next two dates available for a sleep deprived EEG are July 16 and July 29th.

## 2019-06-13 ENCOUNTER — Telehealth: Payer: Self-pay | Admitting: *Deleted

## 2019-06-13 NOTE — Telephone Encounter (Signed)
LMOM to please call back to schedule her sleep deprived EEG. I offered August 12th that if it is still available when she calls that is the first 7:30 am appointment available. I stated I have called her 4 times and will not call anymore so as to not be a nuisance. I left our phone number and said anyone who answers the phone can schedule this as well.

## 2019-07-11 ENCOUNTER — Ambulatory Visit (INDEPENDENT_AMBULATORY_CARE_PROVIDER_SITE_OTHER): Payer: 59 | Admitting: Neurology

## 2019-07-11 ENCOUNTER — Other Ambulatory Visit: Payer: Self-pay

## 2019-07-11 DIAGNOSIS — G40909 Epilepsy, unspecified, not intractable, without status epilepticus: Secondary | ICD-10-CM | POA: Diagnosis not present

## 2019-07-12 NOTE — Procedures (Signed)
ELECTROENCEPHALOGRAM REPORT  Date of Study: 07/11/2019  Patient's Name: Cheryl James MRN: [id number] Date of Birth: [birth year]  Clinical History: 52 year old female with history of migraines and seizure disorder.  History of childhood epilepsy that resolved but then returned in her 28s.    Medications: PREMARIN 0.3 MG tablet FIBER ADULT GUMMIES PO INDOCIN 25 MG capsule LINZESS 290 MCG CAPS capsule MULTIVITAMIN tablet  Technical Summary: A multichannel digital EEG recording measured by the international 10-20 system with electrodes applied with paste and impedances below 5000 ohms performed in our laboratory with EKG monitoring in an awake and asleep patient.  Hyperventilation not performed as patient wearing mask due to COVID.  Photic stimulation was performed.  The digital EEG was referentially recorded, reformatted, and digitally filtered in a variety of bipolar and referential montages for optimal display.    Description: The patient is awake and asleep during the recording.  During maximal wakefulness, there is a symmetric, medium voltage 11 Hz posterior dominant rhythm that attenuates with eye opening.  The record is symmetric.  During drowsiness and sleep, there is an increase in theta slowing of the background.  Vertex waves and symmetric sleep spindles were seen.  Photic stimulation did not elicit any abnormalities.  There were no epileptiform discharges or electrographic seizures seen.    EKG lead was unremarkable.  Impression: This awake and asleep EEG is normal.    Clinical Correlation: A normal EEG does not exclude a clinical diagnosis of epilepsy.  If further clinical questions remain, prolonged EEG may be helpful.  Clinical correlation is advised.   Metta Clines, DO

## 2019-07-15 ENCOUNTER — Other Ambulatory Visit: Payer: Self-pay

## 2019-07-15 NOTE — Telephone Encounter (Signed)
-----   Message from Pieter Partridge, DO sent at 07/12/2019 11:13 AM EDT ----- EEG looks normal.  If agreeable, we can start Keppra 500mg  twice daily for seizure prophylaxis.  It is typically well-tolerated.

## 2019-07-15 NOTE — Telephone Encounter (Signed)
Advised patient of EEG results, Please send in [facility] thanks

## 2019-12-10 ENCOUNTER — Other Ambulatory Visit: Payer: Self-pay | Admitting: Neurology

## 2020-01-19 ENCOUNTER — Other Ambulatory Visit: Payer: Self-pay

## 2020-01-19 ENCOUNTER — Emergency Department (HOSPITAL_COMMUNITY): Payer: 59

## 2020-01-19 ENCOUNTER — Emergency Department (HOSPITAL_COMMUNITY)
Admission: EM | Admit: 2020-01-19 | Discharge: 2020-01-19 | Disposition: A | Discharge status: Home / Self Care | Payer: 59 | Attending: Emergency Medicine | Admitting: Emergency Medicine

## 2020-01-19 ENCOUNTER — Encounter (HOSPITAL_COMMUNITY): Payer: Self-pay | Admitting: Emergency Medicine

## 2020-01-19 DIAGNOSIS — Z79899 Other long term (current) drug therapy: Secondary | ICD-10-CM | POA: Insufficient documentation

## 2020-01-19 DIAGNOSIS — R202 Paresthesia of skin: Secondary | ICD-10-CM | POA: Diagnosis not present

## 2020-01-19 DIAGNOSIS — M542 Cervicalgia: Secondary | ICD-10-CM | POA: Insufficient documentation

## 2020-01-19 DIAGNOSIS — E039 Hypothyroidism, unspecified: Secondary | ICD-10-CM | POA: Insufficient documentation

## 2020-01-19 HISTORY — DX: Hypothyroidism, unspecified: E03.9

## 2020-01-19 HISTORY — DX: Hyperlipidemia, unspecified: E78.5

## 2020-01-19 LAB — BASIC METABOLIC PANEL
Anion gap: 9 (ref 5–15)
BUN: 11 mg/dL (ref 6–20)
CO2: 25 mmol/L (ref 22–32)
Calcium: 8.9 mg/dL (ref 8.9–10.3)
Chloride: 104 mmol/L (ref 98–111)
Creatinine, Ser: 0.92 mg/dL (ref 0.44–1.00)
GFR calc Af Amer: >60 mL/min (ref >60)
GFR calc non Af Amer: >60 mL/min (ref >60)
Glucose, Bld: 93 mg/dL (ref 70–99)
Potassium: 4.1 mmol/L (ref 3.5–5.1)
Sodium: 138 mmol/L (ref 135–145)

## 2020-01-19 LAB — CBC
HCT: 42 % (ref 36.0–46.0)
Hemoglobin: 13.9 g/dL (ref 12.0–15.0)
MCH: 31.9 pg (ref 26.0–34.0)
MCHC: 33.1 g/dL (ref 30.0–36.0)
MCV: 96.3 fL (ref 80.0–100.0)
Platelets: 227 10*3/uL (ref 150–400)
RBC: 4.36 MIL/uL (ref 3.87–5.11)
RDW: 11.9 % (ref 11.5–15.5)
WBC: 4.8 10*3/uL (ref 4.0–10.5)
nRBC: 0 % (ref 0.0–0.2)

## 2020-01-19 LAB — TROPONIN I (HIGH SENSITIVITY)
Troponin I (High Sensitivity): <2 ng/L (ref <18)
Troponin I (High Sensitivity): <2 ng/L (ref <18)

## 2020-01-19 LAB — I-STAT BETA HCG BLOOD, ED (MC, WL, AP ONLY): I-stat hCG, quantitative: <5 m[IU]/mL (ref <5)

## 2020-01-19 MED ORDER — SODIUM CHLORIDE 0.9% FLUSH: 3.0000 mL | Freq: Once | INTRAVENOUS | Status: DC

## 2020-01-19 NOTE — ED Notes (Signed)
Pt back from radiology 

## 2020-01-19 NOTE — ED Triage Notes (Signed)
Pt states she woke up with pain between her shoulder blades that went into her left neck. Pt states she then developed a dull ache in her left arm. Pt states the aching in the left arm has subsided, but it feels heavy. Denies SOB/CP today, but does states she felt SOB yesterday. Pt also had an episode of this shoulder blade, neck pain on Saturday, took an ASA and pain went away.

## 2020-01-19 NOTE — ED Provider Notes (Signed)
[facility] EMERGENCY DEPARTMENT Provider Note   CSN: [id number] Arrival date & time: 01/19/20  1023     History Chief Complaint  Patient presents with  . Neck Pain    Cheryl James is a 54 y.o. female.  HPI She presents for evaluation of pain in her upper back, between shoulder blades and lower neck patient of the pain to the left arm.  She has had the symptoms intermittently for about 5 days.  She also has a tingling sensation in her left thumb and index finger.  She denies headache, focal weakness focal weakness, fever, chills, nausea or vomiting.  No recent or remote trauma to the head or neck.  No other recent illnesses.  There are no other known modifying factors.    Past Medical History:  Diagnosis Date  . Constipation   . Epilepsy ([place])   . Hyperlipemia   . Hypothyroid   . Migraine     Patient Active Problem List   Diagnosis Date Noted  . History of DVT (deep vein thrombosis) 12/03/2018  . Epilepsy ([place]) 12/03/2018  . Migraine 12/03/2018  . Constipation 12/03/2018  . GI bleed 12/03/2016    Past Surgical History:  Procedure Laterality Date  . BREAST BIOPSY  2000   benign  . CERVICAL CONE BIOPSY  1992  . CYSTOCELE REPAIR  2005  . RECTOCELE REPAIR  2005  . TOTAL COLECTOMY  2002  . TUBAL LIGATION  1995  . VAGINAL HYSTERECTOMY  2005     OB History   No obstetric history on file.     Family History  Problem Relation Age of Onset  . Cancer Mother        cervical  . Rectal cancer Mother   . Alcohol abuse Mother   . Arthritis Mother   . Hearing loss Mother        Meniere's disease  . Alcohol abuse Father   . Lung cancer Father   . Depression Father   . Heart attack Father 43  . Mental illness Father        PTSD, agoraphobia  . Heart attack Maternal Grandfather 55  . Heart attack Paternal Grandmother 58  . Leukemia Paternal Grandfather   . Alcohol abuse Sister   . Depression Sister   . Drug abuse Sister   . Depression  Daughter   . Colon cancer Maternal Aunt     Social History   Tobacco Use  . Smoking status: Never Smoker  . Smokeless tobacco: Never Used  Substance Use Topics  . Alcohol use: Yes    Comment: 1 weekend  . Drug use: Never    Home Medications Prior to Admission medications   Medication Sig Start Date End Date Taking? Authorizing Provider  estrogens, conjugated, (PREMARIN) 0.625 MG tablet Take 0.625 mg by mouth daily.    Yes [provider]  FIBER ADULT GUMMIES PO Take 1 tablet by mouth daily.    Yes [provider]  indomethacin (INDOCIN) 25 MG capsule TAKE ONE CAPSULE BY MOUTH DAILY Patient taking differently: Take 25 mg by mouth daily.  12/12/19  Yes Tomi Likens, Adam R, DO  levothyroxine (SYNTHROID) 25 MCG tablet Take 25 mcg by mouth daily. 12/29/19  Yes [provider]  linaclotide (LINZESS) 290 MCG CAPS capsule Take 1 capsule (290 mcg total) by mouth daily before breakfast. Patient taking differently: Take 290 mcg by mouth as needed (constipation).  01/10/19  Yes Thornton Park, MD  SUMAtriptan (IMITREX) 100 MG  tablet Take 1 tablet earliest onset of headache.  May repeat in 2 hours if headache persists or recurs.  Maximum 2 tablets in 24 hours Patient taking differently: Take 100 mg by mouth every 2 (two) hours as needed for migraine. Take 1 tablet earliest onset of headache.  May repeat in 2 hours if headache persists or recurs.  Maximum 2 tablets in 24 hours 04/22/19  Yes Jaffe, Adam R, DO  topiramate (TOPAMAX) 25 MG tablet Take 1 tablet at bedtime for 7 days, then 1 tablet twice daily for 7 days, then 2 tablets twice daily. Patient not taking: Reported on 01/19/2020 04/22/19   Drema Dallas, DO    Allergies    Other  Review of Systems   Review of Systems  All other systems reviewed and are negative.   Physical Exam Updated Vital Signs BP (!) 144/115   Pulse (!) 51   Temp 98 F (36.7 C) (Oral)   Resp 12   Ht 5\' 2"  (1.575 m)   Wt 59 kg   SpO2 97%    BMI 23.78 kg/m   Physical Exam Vitals and nursing note reviewed.  Constitutional:      General: She is not in acute distress.    Appearance: She is well-developed. She is not ill-appearing or diaphoretic.  HENT:     Head: Normocephalic and atraumatic.     Right Ear: External ear normal.     Left Ear: External ear normal.  Eyes:     Conjunctiva/sclera: Conjunctivae normal.     Pupils: Pupils are equal, round, and reactive to light.  Neck:     Trachea: Phonation normal.  Cardiovascular:     Rate and Rhythm: Normal rate.  Pulmonary:     Effort: Pulmonary effort is normal.  Abdominal:     General: There is no distension.  Musculoskeletal:        General: No swelling, deformity or signs of injury. Normal range of motion.     Cervical back: Normal range of motion and neck supple.  Skin:    General: Skin is warm and dry.  Neurological:     Mental Status: She is alert and oriented to person, place, and time.     Cranial Nerves: No cranial nerve deficit.     Motor: No abnormal muscle tone.     Coordination: Coordination normal.     Comments: Mild light touch dysesthesia of left thumb and index finger.  Psychiatric:        Mood and Affect: Mood normal.        Behavior: Behavior normal.        Thought Content: Thought content normal.        Judgment: Judgment normal.     ED Results / Procedures / Treatments   Labs (all labs ordered are listed, but only abnormal results are displayed) Labs Reviewed  BASIC METABOLIC PANEL  CBC  I-STAT BETA HCG BLOOD, ED ([facility], [facility], [facility] ONLY)  TROPONIN I (HIGH SENSITIVITY)  TROPONIN I (HIGH SENSITIVITY)    EKG EKG Interpretation  Date/Time:  Thursday January 19 2020 10:38:03 EST Ventricular Rate:  53 PR Interval:    QRS Duration: 100 QT Interval:  422 QTC Calculation: 397 R Axis:   64 Text Interpretation: Sinus rhythm No old tracing to compare Confirmed by 07-17-1969 [phone]) on 01/19/2020 10:49:22 AM   Radiology DG Chest 2  View  Result Date: 01/19/2020 CLINICAL DATA:  Chest pain EXAM: CHEST - 2 VIEW COMPARISON:  None.  FINDINGS: Lungs are clear. Heart size and pulmonary vascularity are normal. No adenopathy. No pneumothorax. No bone lesions. IMPRESSION: No abnormality noted. Electronically Signed   By: Bretta Bang III M.D.   On: 01/19/2020 11:00   CT Cervical Spine Wo Contrast  Result Date: 01/19/2020 CLINICAL DATA:  Cervical radiculopathy EXAM: CT CERVICAL SPINE WITHOUT CONTRAST TECHNIQUE: Multidetector CT imaging of the cervical spine was performed without intravenous contrast. Multiplanar CT image reconstructions were also generated. COMPARISON:  None. FINDINGS: Alignment: No significant antero posterior listhesis. Skull base and vertebrae: Vertebral body heights are maintained. There is no destructive osseous lesion. Soft tissues and spinal canal: No prevertebral fluid or swelling. No visible canal hematoma. Disc levels: Intervertebral disc heights are maintained. Minor degenerative changes are present. There is no high-grade canal or foraminal stenosis. Upper chest: Included upper lungs are clear. Other: None. IMPRESSION: No significant degenerative stenosis. If symptoms persist, consider MR imaging. Electronically Signed   By: Guadlupe Spanish M.D.   On: 01/19/2020 14:01    Procedures Procedures (including critical care time)  Medications Ordered in ED Medications  sodium chloride flush (NS) 0.9 % injection 3 mL (3 mLs Intravenous Not Given 01/19/20 1050)    ED Course  I have reviewed the triage vital signs and the nursing notes.  Pertinent labs & imaging results that were available during my care of the patient were reviewed by me and considered in my medical decision making (see chart for details).  Clinical Course as of Jan 18 1433  Thu Jan 19, 2020  1105 No infiltrate or CHF, interpreted by me  DG Chest 2 View [EW]    Clinical Course User Index [EW] Mancel Bale, MD   MDM  Rules/Calculators/A&P                       Patient Vitals for the past 24 hrs:  BP Temp Temp src Pulse Resp SpO2 Height Weight  01/19/20 1233 (!) 144/115 -- -- (!) 51 12 97 % -- --  01/19/20 1038 133/88 98 F (36.7 C) Oral -- 12 100 % -- --  01/19/20 1035 -- -- -- -- -- -- 5\' 2"  (1.575 m) 59 kg  01/19/20 1032 139/86 97.8 F (36.6 C) Oral (!) 55 16 100 % -- --    2:34 PM Reevaluation with update and discussion. After initial assessment and treatment, an updated evaluation reveals she states her symptoms have almost completely resolved with just a little residual discomfort in her upper back and some very mild dysesthesia of her left thumb and first finger.  She denies headache, dizziness or weakness at this time.  Findings discussed and questions answered. 01/21/20   Medical Decision Making: Nonspecific neck and upper back pain, with numbness left thumb and first finger.  Suspect neuropathy, felt cervical myelopathy.  Medication for additional treatment, at this time.  Loralei Zareah Hunzeker was evaluated in Emergency Department on 01/19/2020 for the symptoms described in the history of present illness. She was evaluated in the context of the global COVID-19 pandemic, which necessitated consideration that the patient might be at risk for infection with the SARS-CoV-2 virus that causes COVID-19. Institutional protocols and algorithms that pertain to the evaluation of patients at risk for COVID-19 are in a state of rapid change based on information released by regulatory bodies including the CDC and federal and state organizations. These policies and algorithms were followed during the patient's care in the ED.   CRITICAL CARE-no Performed by:  Mancel Bale   Nursing Notes Reviewed/ Care Coordinated Applicable Imaging Reviewed Interpretation of Laboratory Data incorporated into ED treatment  The patient appears reasonably screened and/or stabilized for discharge and I doubt any other  medical condition or other [facility] requiring further screening, evaluation, or treatment in the ED at this time prior to discharge.  Plan: Home Medications-continue usual, use Tylenol for pain; Home Treatments-heat to affected area of neck and upper back; return here if the recommended treatment, does not improve the symptoms; Recommended follow up-PCP follow-up if not better in few days.    Final Clinical Impression(s) / ED Diagnoses Final diagnoses:  Neck pain    Rx / DC Orders ED Discharge Orders    None       Mancel Bale, MD 01/19/20 1436

## 2020-01-19 NOTE — ED Notes (Signed)
Patient transported to X-ray 

## 2020-01-19 NOTE — Discharge Instructions (Addendum)
The testing today did not show any significant problems associated with her upper back and lower neck pain.  It is likely that you have a nerve impingement causing her symptoms, including the numbness in your left thumb and first finger.  To help treat the symptoms, use some heat on the sore areas 2 or 3 times a day and take Tylenol every 4 hours for pain.  See your doctor if not better in few days or if you have additional symptoms.

## 2020-02-15 ENCOUNTER — Other Ambulatory Visit: Payer: Self-pay | Admitting: Neurology

## 2020-02-20 ENCOUNTER — Other Ambulatory Visit: Payer: Self-pay | Admitting: Neurology

## 2020-02-20 NOTE — Telephone Encounter (Signed)
Patient needs an appointment for further refills.

## 2020-02-20 NOTE — Telephone Encounter (Signed)
Pt requesting ENDOCIN 25 mg sent to [facility] [address]. Informed pt of prior note stating pt needs appt. No available appts available until July, also pt states medication has helped more than any other in the past. Please advise.

## 2020-02-20 NOTE — Telephone Encounter (Signed)
Pls have her make an appointment. Would only do 10 tablets a month, Dr. Everlena Cooper out this week, if this is something she needs more regularly (ie daily), will let Dr. Everlena Cooper decide.

## 2020-02-20 NOTE — Telephone Encounter (Signed)
Patient called and left a message requesting a refill but I could not make out the name of the medication on the voice mail..   I returned the patient's call and left a message to call back with the name of the medication needed and the pharmacy she will be using.

## 2020-02-21 MED ORDER — INDOMETHACIN 25 MG PO CAPS: 25.0000 mg | ORAL_CAPSULE | Freq: Every day | ORAL | 0 refills | Status: DC

## 2020-02-21 NOTE — Telephone Encounter (Signed)
Indocin refill sent to pharmacy.  Appointment scheduled.

## 2020-02-21 NOTE — Addendum Note (Signed)
Addended by: Judd Gaudier on: 02/21/2020 08:55 AM   Modules accepted: Orders

## 2020-03-06 ENCOUNTER — Telehealth: Payer: Self-pay | Admitting: Neurology

## 2020-03-06 ENCOUNTER — Other Ambulatory Visit: Payer: Self-pay | Admitting: Neurology

## 2020-03-06 MED ORDER — INDOMETHACIN 20 MG PO CAPS: 25.0000 mg | ORAL_CAPSULE | Freq: Every day | ORAL | 4 refills | Status: DC

## 2020-03-06 NOTE — Telephone Encounter (Signed)
Pt needs her medication refill indomethacin she uses the [facility] in [place]   She has a follow up on 05-30-20

## 2020-03-06 NOTE — Telephone Encounter (Signed)
Prescription refilled.

## 2020-05-18 ENCOUNTER — Other Ambulatory Visit: Payer: Self-pay | Admitting: Gastroenterology

## 2020-05-19 NOTE — Telephone Encounter (Signed)
May have 6 month refill. Needs office for additional refills. Thanks.

## 2020-05-22 ENCOUNTER — Other Ambulatory Visit: Payer: Self-pay | Admitting: Gastroenterology

## 2020-05-29 NOTE — Progress Notes (Signed)
NEUROLOGY FOLLOW UP OFFICE NOTE  Cheryl James [id number]  HISTORY OF PRESENT ILLNESS: Cheryl James is a 54 year old woman who follows up for headaches and seizure disorder.  UPDATE: Started topiramate last year, but it worsened her headaches.  She was prescribed indomethacin and she has been doing well since restarting it.  Only one attack.  EEG from 07/11/2019 was normal.  She remains off of antiepileptic medication therapy.  Current NSAIDS:  indomethacin 25mg  daily Current analgesics:  Store-brand migraine medication Current triptans:  sumatriptan 100mg  Current ergotamine:  none Current anti-emetic:  none Current muscle relaxants:  none Current anti-anxiolytic:  none Current sleep aide:  melatonin 10 to 20mg  PRN Current Antihypertensive medications:  none Current Antidepressant medications:  none Current Anticonvulsant medications:  none Current anti-CGRP:  none Current Antihistamines/Decongestants:  none Other therapy:  none Hormone/birth control:  Premarin  Caffeine:  1 cup of coffee daily.  1 cup sleepy tea at night. Alcohol:  1 glass of wine every now and then Smoker:  no Diet:  Hydrates.   Exercise:  routine Depression:  no; Anxiety:  no Other pain:  Chronic abdominal pain Sleep hygiene:  Okay when without headache  HISTORY: She has had migraines since her 80s.  They are 7/10 right sided (primarily frontal/perioribital) throbbing pain associated with right ptosis, rhinorrhea, nausea, photophobia, phonophobia, and sometimes vomiting (but no associated unilateral numbness or weakness).  Initially, they would last 1-2 days and occur 3 times a week, therefore she had near daily headaches.  Emotional stress is a trigger.  Laying down in the dark with a cold rag on her face helped relieve them.  She was started on indomethacin and headaches resolved.  She also has history of epilepsy since third grade.  They presented as black out spells in which she would lose  consciousness and fall to the floor.  They subsequently resolved.  In her 36s, she had a recurrence of seizures presenting as brief staring spells.  She was having about 3 a day.  She was started on oxcarbazepine and seizures resolved, which she has since discontinued.  Past antiepileptic medications included phenobarbital in childhood and carbamazepine in adulthood.  Last seizure was around 2015.  Every now and then, she may have a brief dizzy spell.    Prior workup included MRI and EEG.   Past NSAIDS:  Indomethacin daily (effective) Past analgesics:  Excedrin, Fioricet Past abortive triptans:  none Past abortive ergotamine:  none Past muscle relaxants:  none Past anti-emetic:  none Past antihypertensive medications:  none Past antidepressant medications:  none Past anticonvulsant medications:  phenobarbital, carbamazepine, oxcarbazepine, topiramate (worsened migraines) Past anti-CGRP:  none Other past therapies:  Cup of coffee (ineffective)   Family history of headache:  Sister (migraine), mom  PAST MEDICAL HISTORY: Past Medical History:  Diagnosis Date  . Constipation   . Epilepsy ([facility])   . Hyperlipemia   . Hypothyroid   . Migraine     MEDICATIONS: Current Outpatient Medications on File Prior to Visit  Medication Sig Dispense Refill  . estrogens, conjugated, (PREMARIN) 0.625 MG tablet Take 0.625 mg by mouth daily.     FIBER ADULT GUMMIES PO Take 1 tablet by mouth daily.     . Indomethacin 20 MG CAPS Take 25 mg by mouth daily. 30 capsule 4  . levothyroxine (SYNTHROID) 25 MCG tablet Take 25 mcg by mouth daily.    38s LINZESS 290 MCG CAPS capsule TAKE ONE CAPSULE BY MOUTH DAILY BEFORE BREAKFAST  30 capsule 3  . SUMAtriptan (IMITREX) 100 MG tablet Take 1 tablet earliest onset of headache.  May repeat in 2 hours if headache persists or recurs.  Maximum 2 tablets in 24 hours (Patient taking differently: Take 100 mg by mouth every 2 (two) hours as needed for migraine. Take 1  tablet earliest onset of headache.  May repeat in 2 hours if headache persists or recurs.  Maximum 2 tablets in 24 hours) 10 tablet 2  . topiramate (TOPAMAX) 25 MG tablet Take 1 tablet at bedtime for 7 days, then 1 tablet twice daily for 7 days, then 2 tablets twice daily. (Patient not taking: Reported on 01/19/2020) 120 tablet 0   No current facility-administered medications on file prior to visit.    ALLERGIES: Allergies  Allergen Reactions  . Other Swelling    Motion sickness patches.    FAMILY HISTORY: Family History  Problem Relation Age of Onset  . Cancer Mother        cervical  . Rectal cancer Mother   . Alcohol abuse Mother   . Arthritis Mother   . Hearing loss Mother        Meniere's disease  . Alcohol abuse Father   . Lung cancer Father   . Depression Father   . Heart attack Father 66  . Mental illness Father        PTSD, agoraphobia  . Heart attack Maternal Grandfather 55  . Heart attack Paternal Grandmother 90  . Leukemia Paternal Grandfather   . Alcohol abuse Sister   . Depression Sister   . Drug abuse Sister   . Depression Daughter   . Colon cancer Maternal Aunt     SOCIAL HISTORY: Social History   Socioeconomic History  . Marital status: Married    Spouse name: Not on file  . Number of children: 4  . Years of education: Not on file  . Highest education level: Not on file  Occupational History  . Not on file  Tobacco Use  . Smoking status: Never Smoker  . Smokeless tobacco: Never Used  Vaping Use  . Vaping Use: Never used  Substance and Sexual Activity  . Alcohol use: Yes    Comment: 1 weekend  . Drug use: Never  . Sexual activity: Yes  Other Topics Concern  . Not on file  Social History Narrative   Lives with husband in a two story home. Has 4 children that are grown. Highest level of education ( 3 assoc. Degrees)   Right Handed   Social Determinants of Health   Financial Resource Strain:   . Difficulty of Paying Living Expenses:     Food Insecurity:   . Worried About Programme researcher, broadcasting/film/video in the Last Year:   . Barista in the Last Year:   Transportation Needs:   . Freight forwarder (Medical):   Marland Kitchen Lack of Transportation (Non-Medical):   Physical Activity:   . Days of Exercise per Week:   . Minutes of Exercise per Session:   Stress:   . Feeling of Stress :   Social Connections:   . Frequency of Communication with Friends and Family:   . Frequency of Social Gatherings with Friends and Family:   . Attends Religious Services:   . Active Member of Clubs or Organizations:   . Attends Banker Meetings:   Marland Kitchen Marital Status:   Intimate Partner Violence:   . Fear of Current or Ex-Partner:   . Emotionally  Abused:   Marland Kitchen Physically Abused:   . Sexually Abused:     PHYSICAL EXAM: Blood pressure 112/72, pulse 60, height 5\' 2"  (1.575 m), weight 129 lb 12.8 oz (58.9 kg), SpO2 100 %. General: No acute distress.  Patient appears well-groomed.   Head:  Normocephalic/atraumatic Eyes:  Fundi examined but not visualized Neck: supple, no paraspinal tenderness, full range of motion Heart:  Regular rate and rhythm Lungs:  Clear to auscultation bilaterally Back: No paraspinal tenderness Neurological Exam: alert and oriented to person, place, and time. Attention span and concentration intact, recent and remote memory intact, fund of knowledge intact.  Speech fluent and not dysarthric, language intact.  CN II-XII intact. Bulk and tone normal, muscle strength 5/5 throughout.  Sensation to light touch, temperature and vibration intact.  Deep tendon reflexes 2+ throughout, toes downgoing.  Finger to nose and heel to shin testing intact.  Gait normal, Romberg negative.  IMPRESSION: 1.  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  Given her history of GI issues (chronic abdominal pain, rectal bleeding), we will try topiramate (as it also is treatment for seizure  prophylaxis). 2.  Seizure disorder.  No seizures for several years.  EEG normal.  Off of antiepileptic medication.  PLAN: 1.  Indomethacin 25mg  daily 2.  Start taking melatonin 10mg  every night 3.  Follow up in one year  , DO  CC: , MD

## 2020-05-30 ENCOUNTER — Other Ambulatory Visit: Payer: Self-pay

## 2020-05-30 ENCOUNTER — Encounter: Payer: Self-pay | Admitting: Neurology

## 2020-05-30 ENCOUNTER — Ambulatory Visit: Payer: 59 | Admitting: Neurology

## 2020-05-30 VITALS — BP 112/72 | HR 60 | Ht 62.0 in | Wt 129.8 lb

## 2020-05-30 DIAGNOSIS — G40909 Epilepsy, unspecified, not intractable, without status epilepticus: Secondary | ICD-10-CM

## 2020-05-30 DIAGNOSIS — G4451 Hemicrania continua: Secondary | ICD-10-CM | POA: Diagnosis not present

## 2020-05-30 MED ORDER — INDOMETHACIN 25 MG PO CAPS: 25.0000 mg | ORAL_CAPSULE | Freq: Every day | ORAL | 11 refills | Status: DC

## 2020-05-30 NOTE — Patient Instructions (Signed)
1.  Continue indomethacin 25mg  daily 2.  Start melatonin 10mg  at bedtime 3.  Follow up in one year

## 2020-06-07 ENCOUNTER — Telehealth: Payer: Self-pay | Admitting: Family Medicine

## 2020-06-07 NOTE — Telephone Encounter (Signed)
Left VM and sent a my-chart message stating her appointment time has been changed to 9:00 am and placed on our nurse schedule.

## 2020-06-07 NOTE — Telephone Encounter (Signed)
Pt sent in a my-chart request for the Tdap inj. Sending back for approval to schedule. Mychart scheduled her with PCP but if she is due I will change it to [place]

## 2020-06-07 NOTE — Telephone Encounter (Signed)
Ok for Tdap 

## 2020-06-07 NOTE — Telephone Encounter (Signed)
Pt called backed and was given the below msg.

## 2020-06-18 IMAGING — DX DG CHEST 2V
2 series · 2 of 2 positions shown · non-contrast
Comparison: None.

CLINICAL DATA: Chest pain

EXAM:
CHEST - 2 VIEW

[w chest pa]
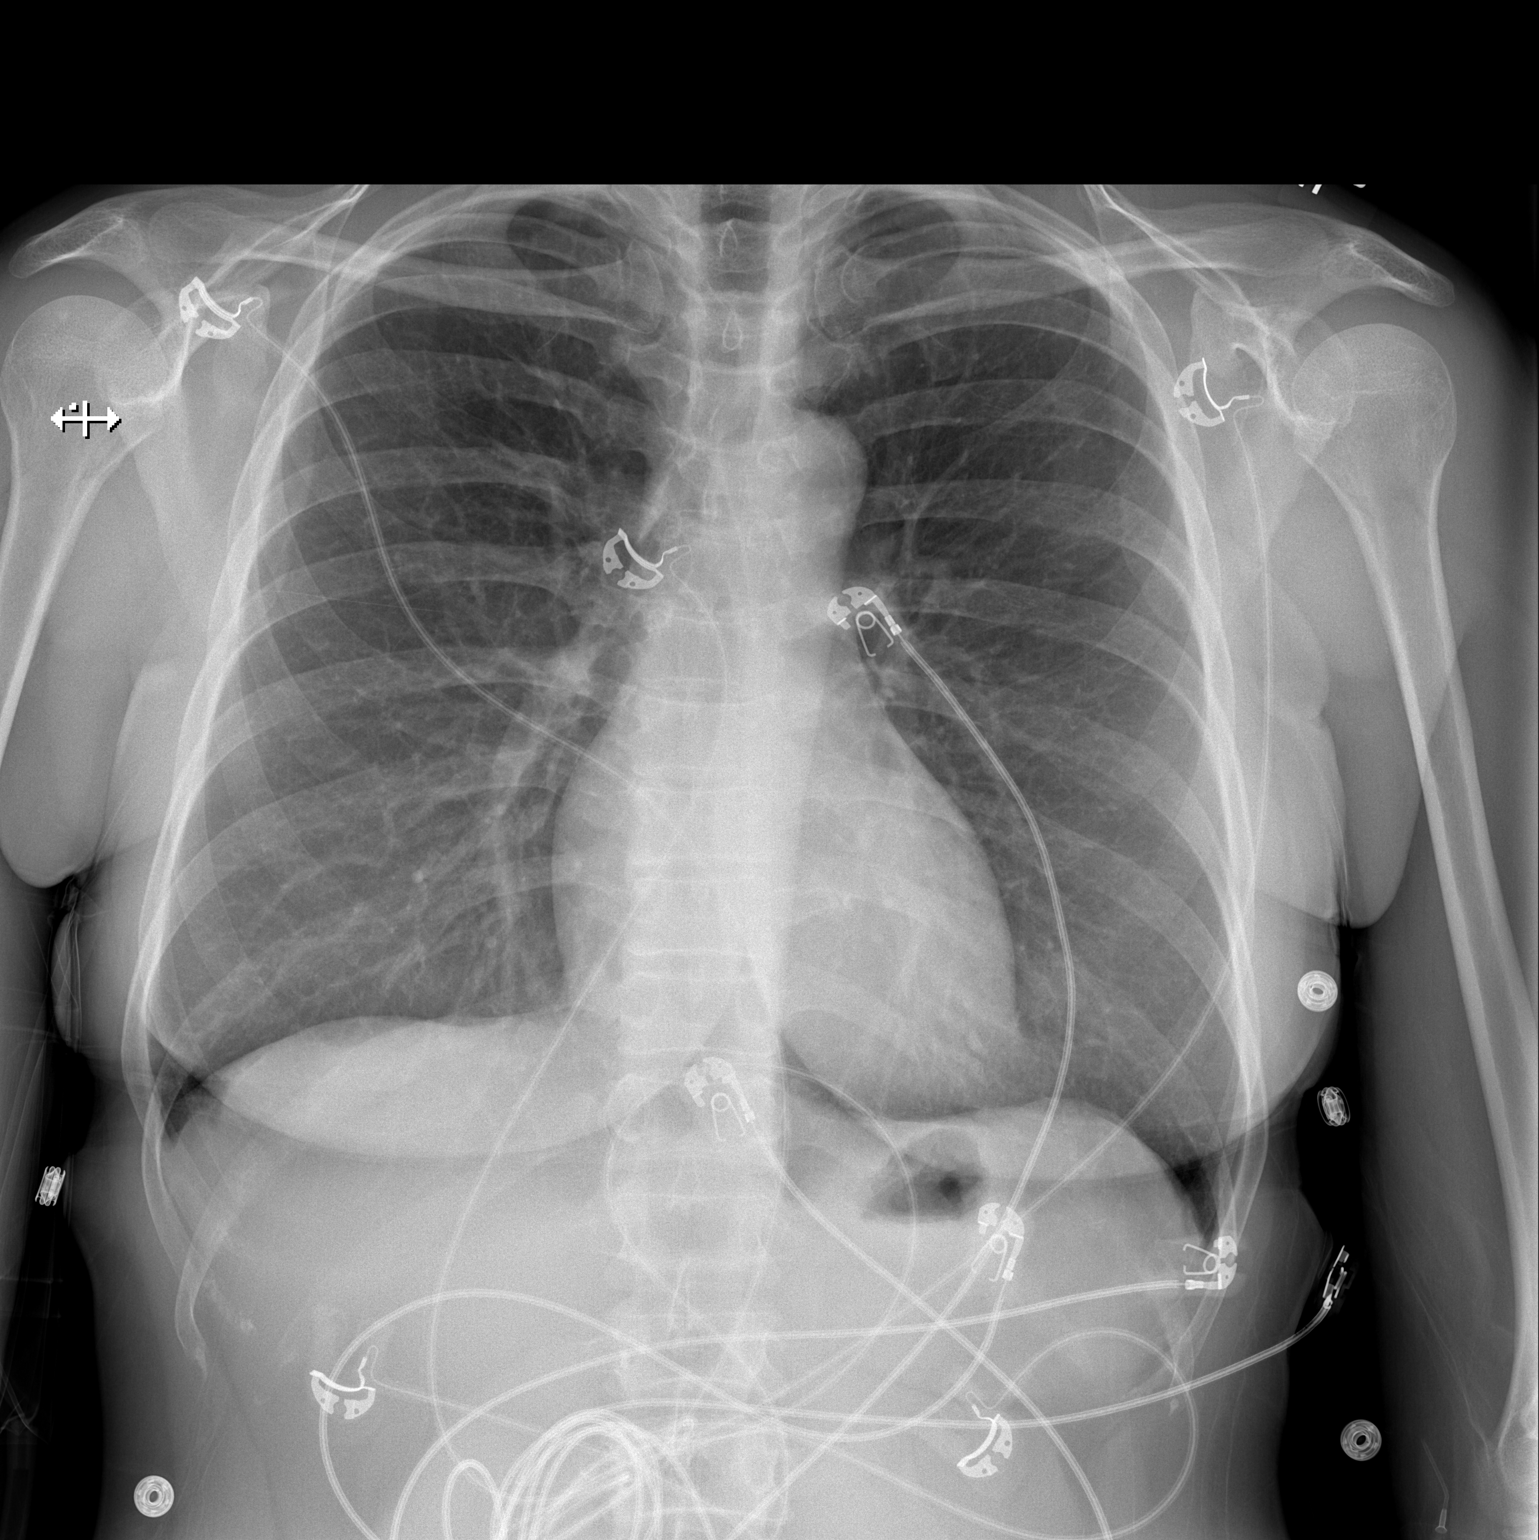

[w chest lat]
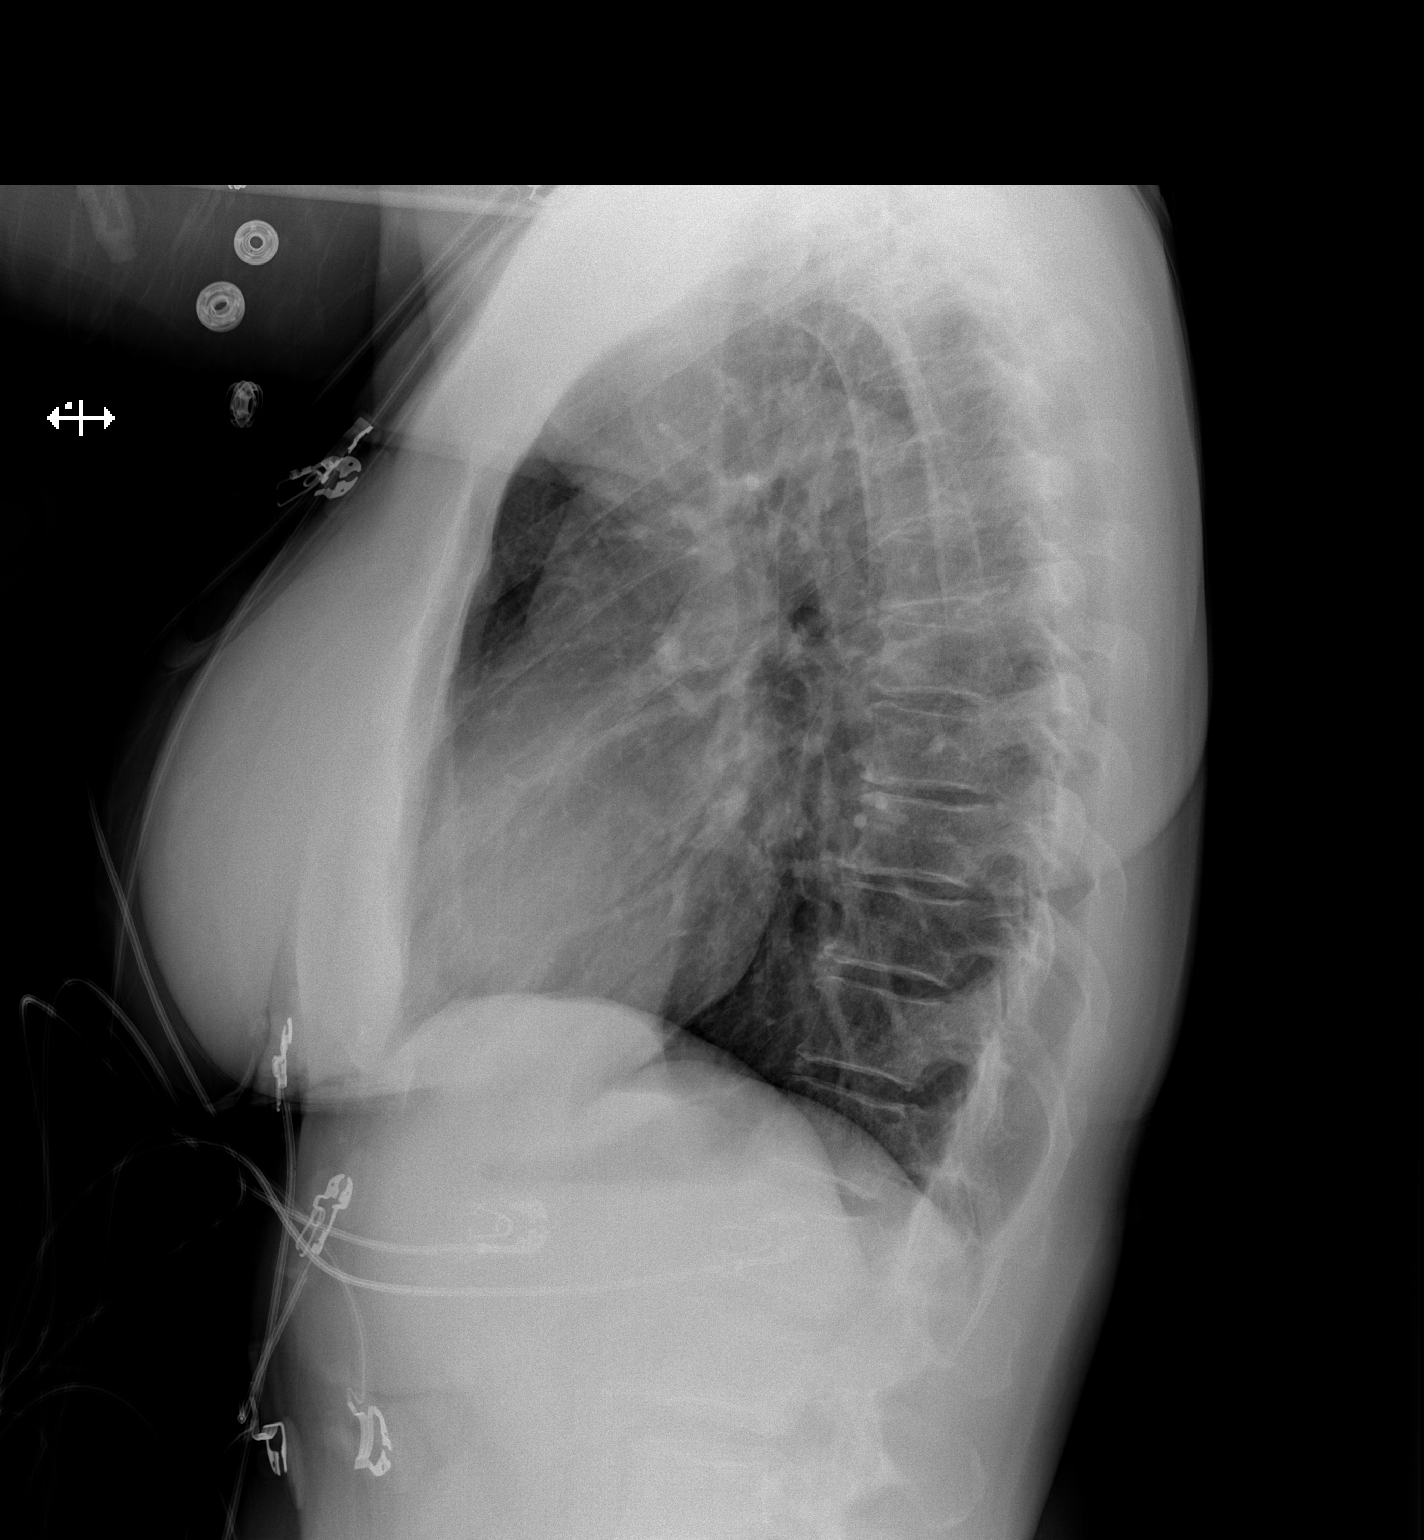

[2 of 2 positions shown; findings below may reference images not displayed]

FINDINGS: Lungs are clear. Heart size and pulmonary vascularity are normal. No
adenopathy. No pneumothorax. No bone lesions.
IMPRESSION: No abnormality noted.

## 2020-06-29 ENCOUNTER — Ambulatory Visit: Payer: 59 | Admitting: Family Medicine

## 2020-06-29 ENCOUNTER — Ambulatory Visit: Payer: 59

## 2020-12-19 LAB — HM PAP SMEAR: HPV, high-risk: NEGATIVE

## 2021-05-28 NOTE — Progress Notes (Signed)
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  No seizures for several years.  EEG normal.  Off antiepileptic medication  1.Indomethacin 25mg  QD 2.sumatriptan 100mg  3.Follow up one year  Subjective:  Cheryl James is a 55 year old woman who follows up for headaches   UPDATE: Intensity: mod-severe Duration:  minutes with sumatriptan Frequency:  Once a month Current NSAIDS:  indomethacin 25mg  daily Current analgesics:  Store-brand migraine medication Current triptans:  sumatriptan 100mg  Current ergotamine:  none Current anti-emetic:  none Current muscle relaxants:  none Current anti-anxiolytic:  none Current sleep aide:  melatonin 10 to 20mg  PRN Current Antihypertensive medications:  none Current Antidepressant medications:  none Current Anticonvulsant medications:  none Current anti-CGRP:  none Current Antihistamines/Decongestants:  none Current supplements/vitamins:  none Other therapy:  none Hormone/birth control:  Premarin   Caffeine:  1 cup of coffee daily.  1 cup sleepy tea at night. Alcohol:  1 glass of wine every now and then Smoker:  no Diet:  Hydrates.   Exercise:  routine Depression:  no; Anxiety:  no Other pain:  Chronic abdominal pain Sleep hygiene:  Okay when without headache   HISTORY:  She has had migraines since her 75s.  They are 7/10 right sided (primarily frontal/perioribital) throbbing pain associated with right ptosis, rhinorrhea, nausea, photophobia, phonophobia, and sometimes vomiting (but no associated unilateral numbness or weakness).  Initially, they would last 1-2 days and occur 3 times a week, therefore she had near daily headaches.  Emotional stress is a trigger.  Laying down in the dark with a cold rag on her face helped relieve them.  She was started on indomethacin and headaches resolved.   She also has  history of epilepsy since third grade.  They presented as black out spells in which she would lose consciousness and fall to the floor.  They subsequently resolved.  In her 15s, she had a recurrence of seizures presenting as brief staring spells.  She was having about 3 a day.  She was started on oxcarbazepine and seizures resolved, which she has since discontinued.  Past antiepileptic medications included phenobarbital in childhood and carbamazepine in adulthood.  Last seizure was around 2015.  Every now and then, she may have a brief dizzy spell.  Prior workup included MRI and EEG.  EEG from 07/11/2019 was normal.       Past NSAIDS:  Indomethacin daily (effective) Past analgesics:  Excedrin, Fioricet Past abortive triptans:  none Past abortive ergotamine:  none Past muscle relaxants:  none Past anti-emetic:  none Past antihypertensive medications:  none Past antidepressant medications:  none Past anticonvulsant medications:  phenobarbital, carbamazepine, oxcarbazepine, topiramate (worsened migraines) Past anti-CGRP:  none Other past therapies:  Cup of coffee (ineffective)     Family history of headache:  Sister (migraine), mom  PAST MEDICAL HISTORY: Past Medical History:  Diagnosis Date   Constipation    Epilepsy (HCC)    Hyperlipemia    Hypothyroid    Migraine     MEDICATIONS: Current Outpatient Medications on File Prior to Visit  Medication Sig Dispense Refill   estrogens, conjugated, (PREMARIN) 0.625 MG tablet Take 0.625 mg by mouth daily.      FIBER ADULT GUMMIES PO Take 1 tablet by mouth daily.      indomethacin (INDOCIN) 25 MG capsule Take 1 capsule (25 mg total) by mouth daily. 30 capsule 11   levothyroxine (  SYNTHROID) 25 MCG tablet Take 25 mcg by mouth daily.     LINZESS 290 MCG CAPS capsule TAKE ONE CAPSULE BY MOUTH DAILY BEFORE BREAKFAST 30 capsule 3   SUMAtriptan (IMITREX) 100 MG tablet Take 1 tablet earliest onset of headache.  May repeat in 2 hours if headache  persists or recurs.  Maximum 2 tablets in 24 hours (Patient taking differently: Take 100 mg by mouth every 2 (two) hours as needed for migraine. Take 1 tablet earliest onset of headache.  May repeat in 2 hours if headache persists or recurs.  Maximum 2 tablets in 24 hours) 10 tablet 2   topiramate (TOPAMAX) 25 MG tablet Take 1 tablet at bedtime for 7 days, then 1 tablet twice daily for 7 days, then 2 tablets twice daily. 120 tablet 0   No current facility-administered medications on file prior to visit.    ALLERGIES: Allergies  Allergen Reactions   Other Swelling    Motion sickness patches.    FAMILY HISTORY: Family History  Problem Relation Age of Onset   Cancer Mother        cervical   Rectal cancer Mother    Alcohol abuse Mother    Arthritis Mother    Hearing loss Mother        Meniere's disease   Alcohol abuse Father    Lung cancer Father    Depression Father    Heart attack Father 37   Mental illness Father        PTSD, agoraphobia   Heart attack Maternal Grandfather 42   Heart attack Paternal Grandmother 22   Leukemia Paternal Grandfather    Alcohol abuse Sister    Depression Sister    Drug abuse Sister    Depression Daughter    Colon cancer Maternal Aunt       Objective:  Blood pressure 125/78, pulse 67, height 5\' 2"  (1.575 m), weight 130 lb 6.4 oz (59.1 kg), SpO2 100 %. General: No acute distress.  Patient appears well-groomed.   Head:  Normocephalic/atraumatic Eyes:  Fundi examined but not visualized Neck: supple, no paraspinal tenderness, full range of motion Heart:  Regular rate and rhythm Lungs:  Clear to auscultation bilaterally Back: No paraspinal tenderness Neurological Exam: alert and oriented to person, place, and time.  Speech fluent and not dysarthric, language intact.  CN II-XII intact. Bulk and tone normal, muscle strength 5/5 throughout.  Sensation to light touch intact.  Deep tendon reflexes 2+ throughout, toes downgoing.  Finger to nose testing  intact.  Gait normal, Romberg negative.   , DO  CC: Shon Millet, MD

## 2021-05-30 ENCOUNTER — Other Ambulatory Visit: Payer: Self-pay

## 2021-05-30 ENCOUNTER — Ambulatory Visit (INDEPENDENT_AMBULATORY_CARE_PROVIDER_SITE_OTHER): Payer: 59 | Admitting: Neurology

## 2021-05-30 ENCOUNTER — Encounter: Payer: Self-pay | Admitting: Neurology

## 2021-05-30 VITALS — BP 125/78 | HR 67 | Ht 62.0 in | Wt 130.4 lb

## 2021-05-30 DIAGNOSIS — G44039 Episodic paroxysmal hemicrania, not intractable: Secondary | ICD-10-CM | POA: Diagnosis not present

## 2021-05-30 DIAGNOSIS — Z8669 Personal history of other diseases of the nervous system and sense organs: Secondary | ICD-10-CM | POA: Diagnosis not present

## 2021-05-30 MED ORDER — SUMATRIPTAN SUCCINATE 100 MG PO TABS: ORAL_TABLET | ORAL | 5 refills | Status: AC

## 2021-05-30 NOTE — Patient Instructions (Signed)
Indomethacin25mg  daily Sumatriptan as needed

## 2021-06-01 ENCOUNTER — Other Ambulatory Visit: Payer: Self-pay | Admitting: Neurology

## 2022-05-02 ENCOUNTER — Ambulatory Visit: Payer: 59 | Admitting: Orthopedic Surgery

## 2022-05-02 ENCOUNTER — Ambulatory Visit (INDEPENDENT_AMBULATORY_CARE_PROVIDER_SITE_OTHER): Payer: 59

## 2022-05-02 ENCOUNTER — Ambulatory Visit: Payer: Self-pay

## 2022-05-02 ENCOUNTER — Encounter: Payer: Self-pay | Admitting: Orthopedic Surgery

## 2022-05-02 VITALS — BP 128/84 | HR 69 | Ht 62.0 in | Wt 131.0 lb

## 2022-05-02 DIAGNOSIS — M25549 Pain in joints of unspecified hand: Secondary | ICD-10-CM | POA: Diagnosis not present

## 2022-05-02 DIAGNOSIS — M25542 Pain in joints of left hand: Secondary | ICD-10-CM | POA: Diagnosis not present

## 2022-05-02 DIAGNOSIS — M180 Bilateral primary osteoarthritis of first carpometacarpal joints: Secondary | ICD-10-CM | POA: Diagnosis not present

## 2022-05-02 MED ORDER — MELOXICAM 7.5 MG PO TABS: 7.5000 mg | ORAL_TABLET | Freq: Every day | ORAL | 0 refills | Status: AC

## 2022-05-02 NOTE — Progress Notes (Signed)
Office Visit Note   Patient: Cheryl James           Date of Birth: 1966/06/08           MRN: [id number] Visit Date: 05/02/2022              Requested by: Wynn Banker, MD [address] [place],  [place] [address] PCP: Wynn Banker, MD (Inactive)   Assessment & Plan: Visit Diagnoses:  1. Pain in thumb joint with movement, unspecified laterality   2. 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  We reviewed her x-rays which showed joint space narrowing, subchondral sclerosis, and osteophyte formation.  We discussed the nature of thumb CMC arthritis as well as his diagnosis, prognosis, both conservative and surgical treatment options.  At this point, given that she has not had no treatment to date, we will start with immobilization with a brace and an oral anti-inflammatory medication.  She would also like to try formal hand therapy to work on strengthening the thumbs.  I can see her back in the office in 6 to 8 weeks to see how she is doing.  Follow-Up Instructions: No follow-ups on file.   Orders:  Orders Placed This Encounter  Procedures   XR Finger Thumb Left   XR Finger Thumb Right   No orders of the defined types were placed in this encounter.     Procedures: No procedures performed   Clinical Data: No additional findings.   Subjective: Chief Complaint  Patient presents with   Left Thumb - Pain   Right Thumb - Pain    RIGHT Handed, Pain: 4/10, lifted weights x 20 years, over the last year she has noticed they are getting weaker, swells over base of thumbs, right is worse than left    This 56 year old right-hand-dominant female presents with pain at bilateral thumbs around the [facility] joints.  Is been going on for at least a year now.  The right is more affected than the left.  She describes dull, aching pain at the base of her thumbs  that is 4/10 at worst.  Pain is worse with certain activities such as opening lids or jars or turning the lock on her door.  She has difficulty with loss of strength in this hand and has difficulty lifting dishes out of cabinets.  She is a very avid weightlifter and over the last year or so noticed a significant decrease in her ability to lift weights secondary to thumb pain and weakness.  She had no treatment for this so far.    Review of Systems   Objective: Vital Signs: BP 128/84 (BP Location: Left Arm, Patient Position: Sitting)   Pulse 69   Ht 5\' 2"  (1.575 m)   Wt 131 lb (59.4 kg)   BMI 23.96 kg/m   Physical Exam Constitutional:      Appearance: Normal appearance.  Cardiovascular:     Rate and Rhythm: Normal rate.     Pulses: Normal pulses.  Pulmonary:     Effort: Pulmonary effort is normal.  Skin:    General: Skin is warm and dry.     Capillary Refill: Capillary refill takes less than 2 seconds.  Neurological:     Mental Status: She is alert.     Right Hand Exam   Tenderness  Right hand tenderness location: TTP at base of thumb at  CMC joint.  Range of Motion  The patient has normal right wrist ROM.   Other  Erythema: absent Sensation: normal Pulse: present  Comments:  Positive CMC grind test w/ pain and crepitus.  No static or dynamic MP hyper-extension.  Very mild palmar abduction contracture.    Left Hand Exam   Tenderness  Left hand tenderness location: TTP at base of thumb at [facility] joint.   Range of Motion  The patient has normal left wrist ROM.  Other  Erythema: absent Sensation: normal Pulse: present  Comments:  Positive CMC grind test w/ pain and crepitus.  No static or dynamic MP hyper-extension.  Very mild palmar abduction contracture.       Specialty Comments:  No specialty comments available.  Imaging: No results found.   PMFS History: Patient Active Problem List   Diagnosis Date Noted   Osteoarthritis of carpometacarpal [facility])  joint of both thumbs 05/02/2022   History of DVT (deep vein thrombosis) 12/03/2018   Epilepsy (HCC) 12/03/2018   Migraine 12/03/2018   Constipation 12/03/2018   GI bleed 12/03/2016   Past Medical History:  Diagnosis Date   Constipation    Epilepsy (HCC)    Hyperlipemia    Hypothyroid    Migraine     Family History  Problem Relation Age of Onset   Cancer Mother        cervical   Rectal cancer Mother    Alcohol abuse Mother    Arthritis Mother    Hearing loss Mother        Meniere's disease   Alcohol abuse Father    Lung cancer Father    Depression Father    Heart attack Father 65   Mental illness Father        PTSD, agoraphobia   Heart attack Maternal Grandfather 41   Heart attack Paternal Grandmother 61   Leukemia Paternal Grandfather    Alcohol abuse Sister    Depression Sister    Drug abuse Sister    Depression Daughter    Colon cancer Maternal Aunt     Past Surgical History:  Procedure Laterality Date   BREAST BIOPSY  2000   benign   CERVICAL CONE BIOPSY  1992   CYSTOCELE REPAIR  2005   RECTOCELE REPAIR  2005   TOTAL COLECTOMY  2002   TUBAL LIGATION  1995   VAGINAL HYSTERECTOMY  2005   Social History   Occupational History   Not on file  Tobacco Use   Smoking status: Never   Smokeless tobacco: Never  Vaping Use   Vaping Use: Never used  Substance and Sexual Activity   Alcohol use: Yes    Comment: 1 weekend   Drug use: Never   Sexual activity: Yes

## 2022-05-13 ENCOUNTER — Encounter: Payer: 59 | Admitting: Rehabilitative and Restorative Service Providers"

## 2022-06-01 NOTE — Progress Notes (Deleted)
NEUROLOGY FOLLOW UP OFFICE NOTE  [name] [id number]  Assessment/Plan:    Paroxysmal hemicrania/hemicrania continua History of seizure disorder  No seizures for several years.  EEG normal.  Off antiepileptic medication   1.Indomethacin 25mg  QD 2.sumatriptan 100mg  3.Follow up one year   Subjective:  Cheryl James is a 56 year old woman who follows up for headaches   UPDATE: Intensity: mod-severe Duration:  minutes with sumatriptan Frequency:  Once a month Current NSAIDS:  indomethacin 25mg  daily Current analgesics:  Store-brand migraine medication Current triptans:  sumatriptan 100mg  Current ergotamine:  none Current anti-emetic:  none Current muscle relaxants:  none Current anti-anxiolytic:  none Current sleep aide:  melatonin 10 to 20mg  PRN Current Antihypertensive medications:  none Current Antidepressant medications:  none Current Anticonvulsant medications:  none Current anti-CGRP:  none Current Antihistamines/Decongestants:  none Current supplements/vitamins:  none Other therapy:  none Hormone/birth control:  Premarin   Caffeine:  1 cup of coffee daily.  1 cup sleepy tea at night. Alcohol:  1 glass of wine every now and then Smoker:  no Diet:  Hydrates.   Exercise:  routine Depression:  no; Anxiety:  no Other pain:  Chronic abdominal pain Sleep hygiene:  Okay when without headache   HISTORY:  She has had migraines since her 74s.  They are 7/10 right sided (primarily frontal/perioribital) throbbing pain associated with right ptosis, rhinorrhea, nausea, photophobia, phonophobia, and sometimes vomiting (but no associated unilateral numbness or weakness).  Initially, they would last 1-2 days and occur 3 times a week, therefore she had near daily headaches.  Emotional stress is a trigger.  Laying down in the dark with a cold rag on her face helped relieve them.  She was started on indomethacin and headaches resolved.   She also has history of epilepsy  since third grade.  They presented as black out spells in which she would lose consciousness and fall to the floor.  They subsequently resolved.  In her 74s, she had a recurrence of seizures presenting as brief staring spells.  She was having about 3 a day.  She was started on oxcarbazepine and seizures resolved, which she has since discontinued.  Past antiepileptic medications included phenobarbital in childhood and carbamazepine in adulthood.  Last seizure was around 2015.  Every now and then, she may have a brief dizzy spell.  Prior workup included MRI and EEG.  EEG from 07/11/2019 was normal.       Past NSAIDS:  Indomethacin daily (effective) Past analgesics:  Excedrin, Fioricet Past abortive triptans:  none Past abortive ergotamine:  none Past muscle relaxants:  none Past anti-emetic:  none Past antihypertensive medications:  none Past antidepressant medications:  none Past anticonvulsant medications:  phenobarbital, carbamazepine, oxcarbazepine, topiramate (worsened migraines) Past anti-CGRP:  none Other past therapies:  Cup of coffee (ineffective)     Family history of headache:  Sister (migraine), mom  PAST MEDICAL HISTORY: Past Medical History:  Diagnosis Date   Constipation    Epilepsy (HCC)    Hyperlipemia    Hypothyroid    Migraine     MEDICATIONS: Current Outpatient Medications on File Prior to Visit  Medication Sig Dispense Refill   DUPIXENT 300 MG/2ML SOPN Inject 300 mg into the skin every 14 (fourteen) days.     estrogens, conjugated, (PREMARIN) 0.625 MG tablet Take 0.625 mg by mouth daily.  (Patient not taking: Reported on 05/30/2021)     FIBER ADULT GUMMIES PO Take 1 tablet by mouth daily.  levothyroxine (SYNTHROID) 25 MCG tablet Take 25 mcg by mouth daily.     LINZESS 290 MCG CAPS capsule TAKE ONE CAPSULE BY MOUTH DAILY BEFORE BREAKFAST 30 capsule 3   meloxicam (MOBIC) 7.5 MG tablet Take 1 tablet (7.5 mg total) by mouth daily. 30 tablet 0   SUMAtriptan  (IMITREX) 100 MG tablet Take 1 tablet earliest onset of headache.  May repeat in 2 hours if headache persists or recurs.  Maximum 2 tablets in 24 hours 10 tablet 5   topiramate (TOPAMAX) 25 MG tablet Take 1 tablet at bedtime for 7 days, then 1 tablet twice daily for 7 days, then 2 tablets twice daily. (Patient not taking: Reported on 05/30/2021) 120 tablet 0   No current facility-administered medications on file prior to visit.    ALLERGIES: Allergies  Allergen Reactions   Other Swelling    Motion sickness patches.    FAMILY HISTORY: Family History  Problem Relation Age of Onset   Cancer Mother        cervical   Rectal cancer Mother    Alcohol abuse Mother    Arthritis Mother    Hearing loss Mother        Meniere's disease   Alcohol abuse Father    Lung cancer Father    Depression Father    Heart attack Father 28   Mental illness Father        PTSD, agoraphobia   Heart attack Maternal Grandfather 23   Heart attack Paternal Grandmother 71   Leukemia Paternal Grandfather    Alcohol abuse Sister    Depression Sister    Drug abuse Sister    Depression Daughter    Colon cancer Maternal Aunt       Objective:  *** General: No acute distress.  Patient appears well-groomed.   Head:  Normocephalic/atraumatic Eyes:  Fundi examined but not visualized Neck: supple, no paraspinal tenderness, full range of motion Heart:  Regular rate and rhythm Lungs:  Clear to auscultation bilaterally Back: No paraspinal tenderness Neurological Exam: alert and oriented to person, place, and time.  Speech fluent and not dysarthric, language intact.  CN II-XII intact. Bulk and tone normal, muscle strength 5/5 throughout.  Sensation to light touch intact.  Deep tendon reflexes 2+ throughout, toes downgoing.  Finger to nose testing intact.  Gait normal, Romberg negative.   Shon Millet, DO  CC: Cheryl Shove, MD

## 2022-06-02 ENCOUNTER — Ambulatory Visit: Payer: 59 | Admitting: Neurology

## 2022-06-13 ENCOUNTER — Ambulatory Visit: Payer: 59 | Admitting: Orthopedic Surgery

## 2022-10-03 ENCOUNTER — Ambulatory Visit: Payer: 59 | Admitting: Neurology

## 2023-06-23 LAB — HM MAMMOGRAPHY

## 2023-07-09 ENCOUNTER — Encounter (INDEPENDENT_AMBULATORY_CARE_PROVIDER_SITE_OTHER): Payer: Self-pay

## 2023-08-20 ENCOUNTER — Ambulatory Visit: Payer: 59 | Admitting: Family Medicine

## 2023-08-20 ENCOUNTER — Encounter: Payer: Self-pay | Admitting: Family Medicine

## 2023-08-20 VITALS — BP 92/58 | HR 47 | Temp 97.9°F | Ht 62.0 in | Wt 129.6 lb

## 2023-08-20 DIAGNOSIS — Z0000 Encounter for general adult medical examination without abnormal findings: Secondary | ICD-10-CM | POA: Diagnosis not present

## 2023-08-20 DIAGNOSIS — E039 Hypothyroidism, unspecified: Secondary | ICD-10-CM | POA: Diagnosis not present

## 2023-08-20 DIAGNOSIS — Z13220 Encounter for screening for lipoid disorders: Secondary | ICD-10-CM | POA: Diagnosis not present

## 2023-08-26 ENCOUNTER — Other Ambulatory Visit (INDEPENDENT_AMBULATORY_CARE_PROVIDER_SITE_OTHER): Payer: 59

## 2023-08-26 DIAGNOSIS — Z0000 Encounter for general adult medical examination without abnormal findings: Secondary | ICD-10-CM

## 2023-08-26 DIAGNOSIS — Z13220 Encounter for screening for lipoid disorders: Secondary | ICD-10-CM

## 2023-08-26 DIAGNOSIS — E039 Hypothyroidism, unspecified: Secondary | ICD-10-CM

## 2023-08-26 LAB — LIPID PANEL
Cholesterol: 246 mg/dL — ABNORMAL HIGH (ref 0–200)
HDL: 96.5 mg/dL (ref >39.00)
LDL Cholesterol: 136 mg/dL — ABNORMAL HIGH (ref 0–99)
NonHDL: 149.57
Total CHOL/HDL Ratio: 3
Triglycerides: 67 mg/dL (ref 0.0–149.0)
VLDL: 13.4 mg/dL (ref 0.0–40.0)

## 2023-08-26 LAB — CBC WITH DIFFERENTIAL/PLATELET
Basophils Absolute: 0 10*3/uL (ref 0.0–0.1)
Basophils Relative: 0.9 % (ref 0.0–3.0)
Eosinophils Absolute: 0.2 10*3/uL (ref 0.0–0.7)
Eosinophils Relative: 3.9 % (ref 0.0–5.0)
HCT: 40.2 % (ref 36.0–46.0)
Hemoglobin: 13.5 g/dL (ref 12.0–15.0)
Lymphocytes Relative: 48.4 % — ABNORMAL HIGH (ref 12.0–46.0)
Lymphs Abs: 2 10*3/uL (ref 0.7–4.0)
MCHC: 33.6 g/dL (ref 30.0–36.0)
MCV: 93.1 fL (ref 78.0–100.0)
Monocytes Absolute: 0.4 10*3/uL (ref 0.1–1.0)
Monocytes Relative: 9.2 % (ref 3.0–12.0)
Neutro Abs: 1.6 10*3/uL (ref 1.4–7.7)
Neutrophils Relative %: 37.6 % — ABNORMAL LOW (ref 43.0–77.0)
Platelets: 241 10*3/uL (ref 150.0–400.0)
RBC: 4.31 Mil/uL (ref 3.87–5.11)
RDW: 12.5 % (ref 11.5–15.5)
WBC: 4.2 10*3/uL (ref 4.0–10.5)

## 2023-08-26 LAB — COMPREHENSIVE METABOLIC PANEL
ALT: 20 U/L (ref 0–35)
AST: 24 U/L (ref 0–37)
Albumin: 4 g/dL (ref 3.5–5.2)
Alkaline Phosphatase: 36 U/L — ABNORMAL LOW (ref 39–117)
BUN: 16 mg/dL (ref 6–23)
CO2: 31 meq/L (ref 19–32)
Calcium: 8.9 mg/dL (ref 8.4–10.5)
Chloride: 104 meq/L (ref 96–112)
Creatinine, Ser: 0.91 mg/dL (ref 0.40–1.20)
GFR: 70.79 mL/min (ref >60.00)
Glucose, Bld: 81 mg/dL (ref 70–99)
Potassium: 3.8 meq/L (ref 3.5–5.1)
Sodium: 141 meq/L (ref 135–145)
Total Bilirubin: 1 mg/dL (ref 0.2–1.2)
Total Protein: 6.5 g/dL (ref 6.0–8.3)

## 2023-08-26 LAB — TSH: TSH: 4.87 u[IU]/mL (ref 0.35–5.50)
